# Patient Record
Sex: Female | Born: 1975 | Race: White | Hispanic: No | Marital: Married | Smoking: Never smoker
Health system: Southern US, Community
[De-identification: ages and names within clinical notes are randomized; demographics above are authoritative.]

## PROBLEM LIST (undated history)

## (undated) DIAGNOSIS — J45909 Unspecified asthma, uncomplicated: Secondary | ICD-10-CM

## (undated) DIAGNOSIS — G43909 Migraine, unspecified, not intractable, without status migrainosus: Secondary | ICD-10-CM

## (undated) DIAGNOSIS — T7840XA Allergy, unspecified, initial encounter: Secondary | ICD-10-CM

## (undated) DIAGNOSIS — M5030 Other cervical disc degeneration, unspecified cervical region: Secondary | ICD-10-CM

## (undated) DIAGNOSIS — M51369 Other intervertebral disc degeneration, lumbar region without mention of lumbar back pain or lower extremity pain: Secondary | ICD-10-CM

## (undated) DIAGNOSIS — M5136 Other intervertebral disc degeneration, lumbar region: Secondary | ICD-10-CM

## (undated) HISTORY — PX: TONSILLECTOMY: SUR1361

## (undated) HISTORY — DX: Allergy, unspecified, initial encounter: T78.40XA

## (undated) HISTORY — PX: OTHER SURGICAL HISTORY: SHX169

## (undated) HISTORY — PX: CARPAL TUNNEL RELEASE: SHX101

---

## 2005-04-24 ENCOUNTER — Emergency Department: Payer: Self-pay | Admitting: Emergency Medicine

## 2005-05-07 ENCOUNTER — Ambulatory Visit: Payer: Self-pay | Admitting: Otolaryngology

## 2006-08-06 ENCOUNTER — Emergency Department: Payer: Self-pay | Admitting: Emergency Medicine

## 2006-11-20 ENCOUNTER — Emergency Department: Payer: Self-pay

## 2007-06-22 ENCOUNTER — Emergency Department: Payer: Self-pay | Admitting: Emergency Medicine

## 2007-07-19 ENCOUNTER — Emergency Department: Payer: Self-pay | Admitting: Emergency Medicine

## 2008-07-27 ENCOUNTER — Emergency Department: Payer: Self-pay | Admitting: Emergency Medicine

## 2008-08-17 ENCOUNTER — Ambulatory Visit: Payer: Self-pay | Admitting: Gastroenterology

## 2009-02-08 ENCOUNTER — Emergency Department: Payer: Self-pay | Admitting: Internal Medicine

## 2009-02-18 ENCOUNTER — Emergency Department: Payer: Self-pay | Admitting: Emergency Medicine

## 2009-02-21 ENCOUNTER — Ambulatory Visit: Payer: Self-pay | Admitting: Internal Medicine

## 2009-04-04 ENCOUNTER — Ambulatory Visit: Payer: Self-pay | Admitting: Podiatry

## 2009-04-18 ENCOUNTER — Ambulatory Visit: Payer: Self-pay | Admitting: Podiatry

## 2009-12-22 ENCOUNTER — Emergency Department: Payer: Self-pay | Admitting: Emergency Medicine

## 2010-07-29 ENCOUNTER — Emergency Department: Payer: Self-pay | Admitting: Emergency Medicine

## 2010-09-06 ENCOUNTER — Emergency Department: Payer: Self-pay | Admitting: Emergency Medicine

## 2010-09-21 ENCOUNTER — Emergency Department: Payer: Self-pay | Admitting: *Deleted

## 2010-10-03 ENCOUNTER — Ambulatory Visit: Payer: Self-pay | Admitting: Physician Assistant

## 2010-11-26 ENCOUNTER — Ambulatory Visit: Payer: Self-pay | Admitting: Unknown Physician Specialty

## 2010-12-19 ENCOUNTER — Ambulatory Visit: Payer: Self-pay | Admitting: Unknown Physician Specialty

## 2011-01-22 IMAGING — CR RIGHT ANKLE - COMPLETE 3+ VIEW
1 series · 5 of 5 positions shown · non-contrast
Comparison: none

REASON FOR EXAM: pain 2nd to blunt truama
COMMENTS:   May transport without cardiac monitor

PROCEDURE:     DXR - DXR ANKLE RIGHT COMPLETE  - February 08, 2009 [DATE]
RESULT:     No fracture, dislocation or other acute bony abnormality is
identified. The ankle mortise is well-maintained.

[Series 1: view not recorded · 0.17mm/px · 5 of 5 slices shown]
[im 1/5]
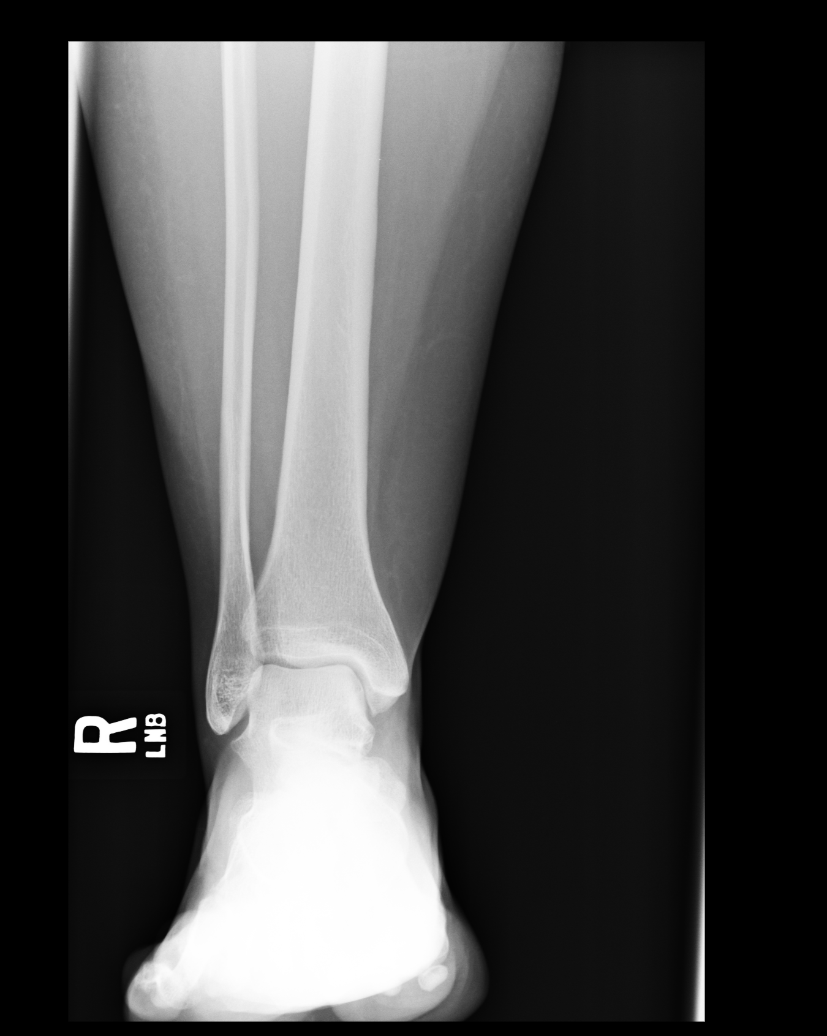
[im 2/5]
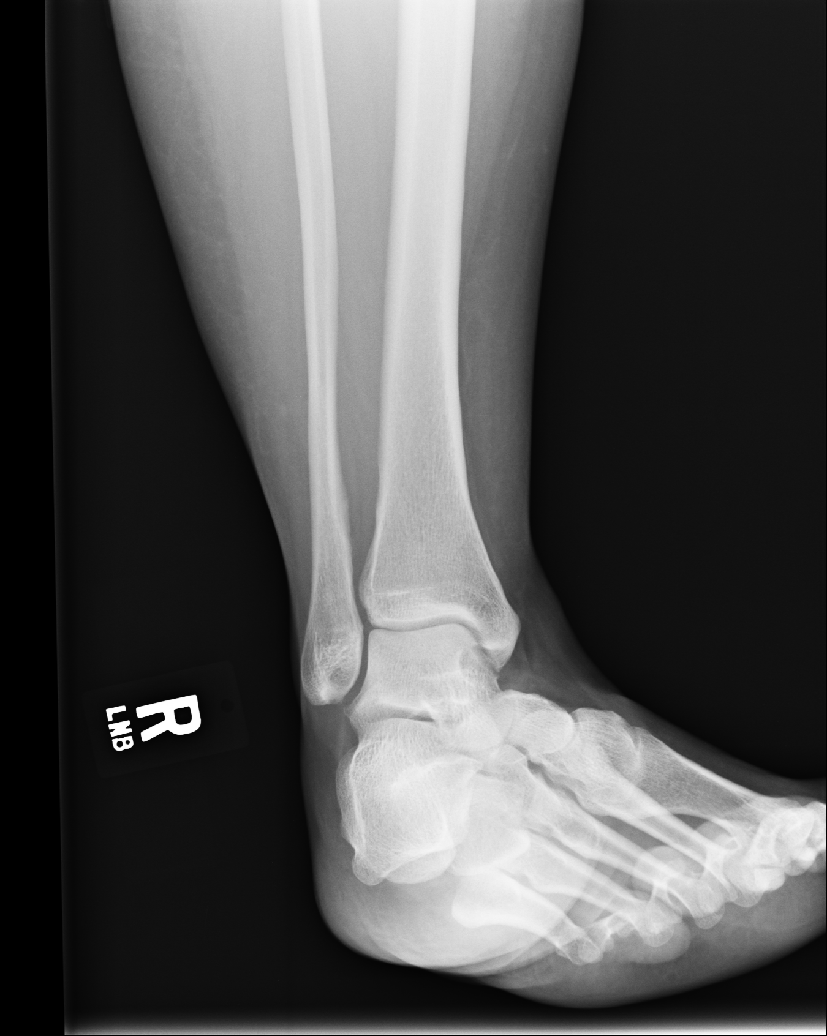
[im 3/5]
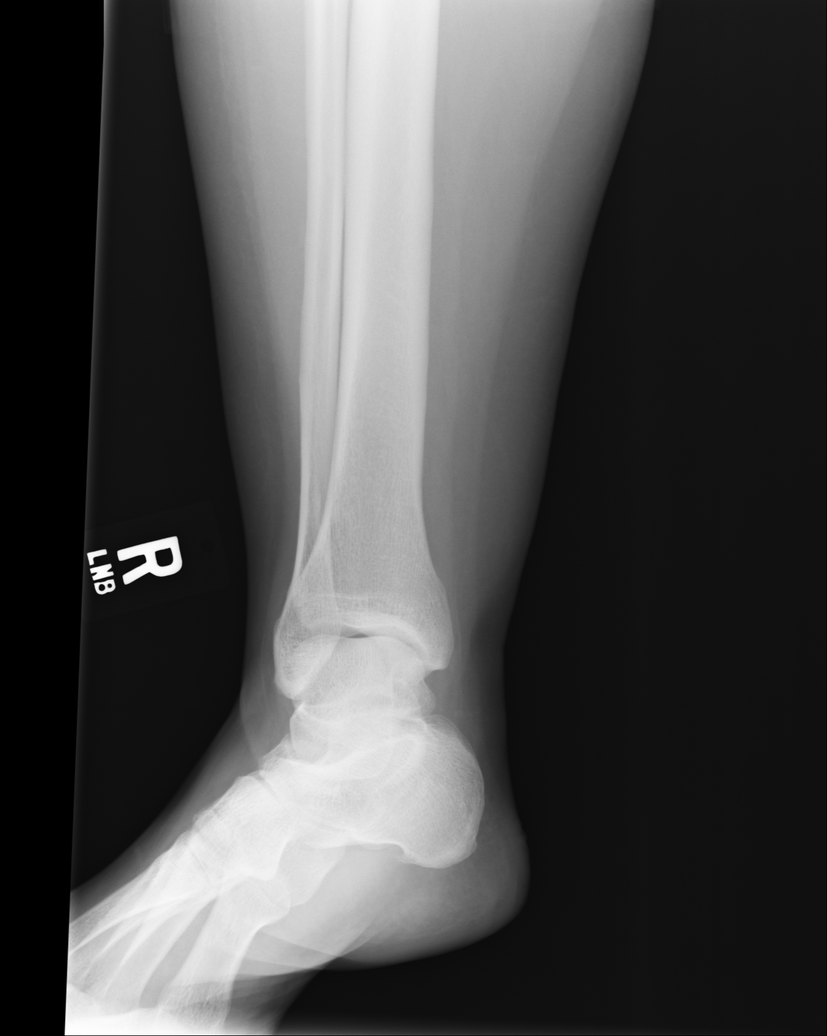
[im 4/5]
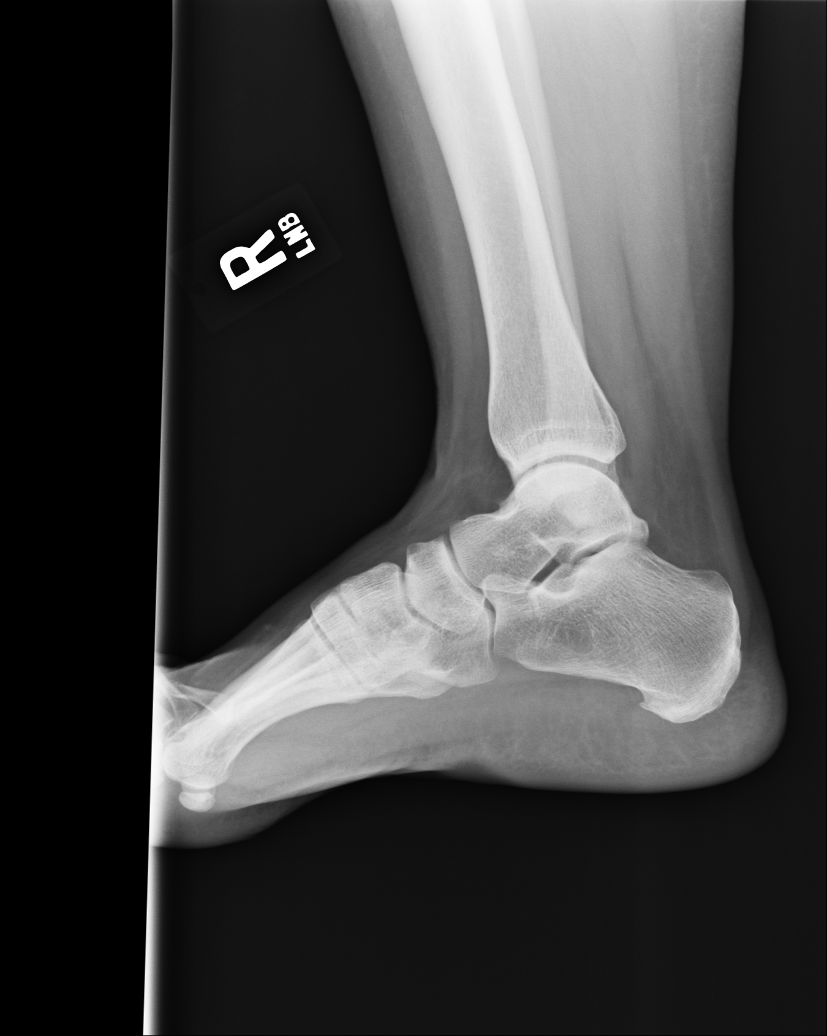
[im 5/5]
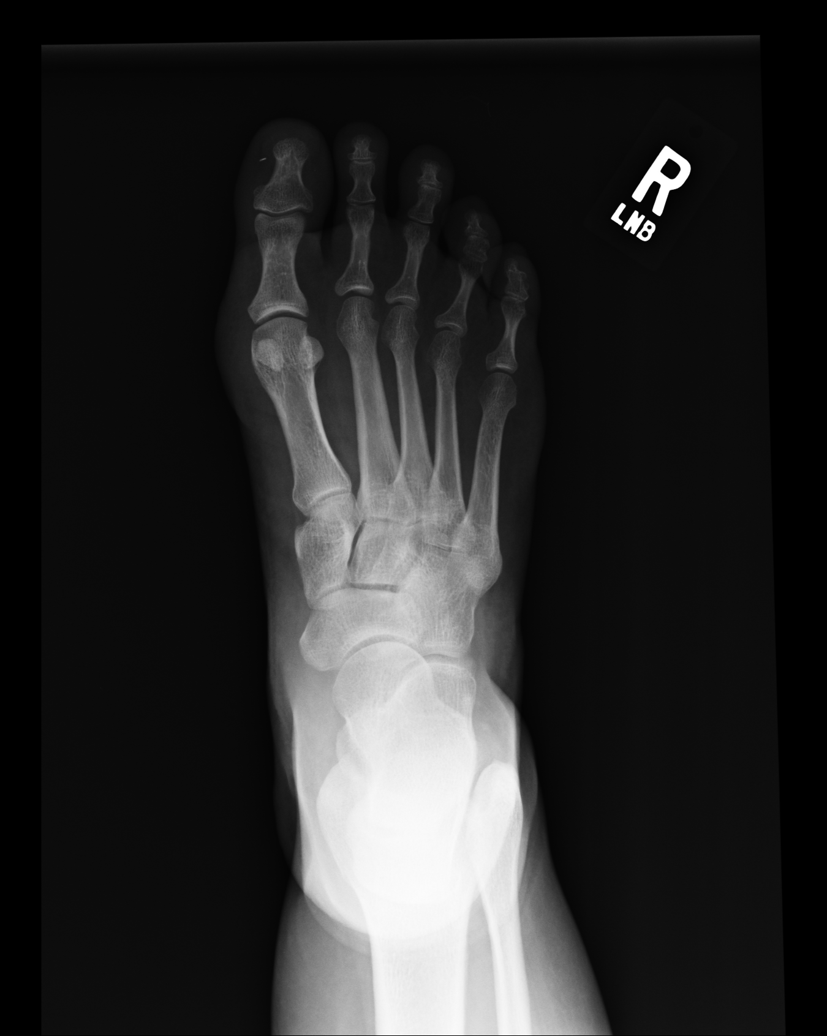

[5 of 5 positions shown; findings below may reference images not displayed]

IMPRESSION: 1.     No acute changes are identified.

## 2011-09-30 ENCOUNTER — Emergency Department: Payer: Self-pay | Admitting: Emergency Medicine

## 2012-03-26 ENCOUNTER — Emergency Department: Payer: Self-pay | Admitting: Emergency Medicine

## 2012-07-27 ENCOUNTER — Emergency Department: Payer: Self-pay | Admitting: Emergency Medicine

## 2012-11-16 ENCOUNTER — Emergency Department: Payer: Self-pay | Admitting: Emergency Medicine

## 2012-12-09 ENCOUNTER — Emergency Department: Payer: Self-pay | Admitting: Internal Medicine

## 2012-12-25 ENCOUNTER — Emergency Department: Payer: Self-pay | Admitting: Emergency Medicine

## 2012-12-25 LAB — COMPREHENSIVE METABOLIC PANEL
Chloride: 103 mmol/L (ref 98–107)
Co2: 26 mmol/L (ref 21–32)
Creatinine: 0.53 mg/dL — ABNORMAL LOW (ref 0.60–1.30)
EGFR (African American): >60
EGFR (Non-African Amer.): >60
Potassium: 3.9 mmol/L (ref 3.5–5.1)
SGOT(AST): 41 U/L — ABNORMAL HIGH (ref 15–37)
SGPT (ALT): 57 U/L (ref 12–78)
Sodium: 135 mmol/L — ABNORMAL LOW (ref 136–145)
Total Protein: 7.4 g/dL (ref 6.4–8.2)

## 2012-12-25 LAB — LIPASE, BLOOD: Lipase: 125 U/L (ref 73–393)

## 2012-12-25 LAB — URINALYSIS, COMPLETE
Bilirubin,UR: NEGATIVE
Glucose,UR: NEGATIVE mg/dL (ref 0–75)
Leukocyte Esterase: NEGATIVE
Ph: 6 (ref 4.5–8.0)
Protein: NEGATIVE
RBC,UR: 3 /HPF (ref 0–5)
Squamous Epithelial: 16
WBC UR: 2 /HPF (ref 0–5)

## 2012-12-25 LAB — CBC
HCT: 36.6 % (ref 35.0–47.0)
MCHC: 33.8 g/dL (ref 32.0–36.0)
RBC: 4.37 10*6/uL (ref 3.80–5.20)

## 2012-12-25 LAB — PREGNANCY, URINE: Pregnancy Test, Urine: NEGATIVE m[IU]/mL

## 2013-05-26 ENCOUNTER — Emergency Department: Payer: Self-pay | Admitting: Emergency Medicine

## 2013-05-26 LAB — COMPREHENSIVE METABOLIC PANEL
ALBUMIN: 3.9 g/dL (ref 3.4–5.0)
ANION GAP: 3 — AB (ref 7–16)
Alkaline Phosphatase: 108 U/L
BUN: 12 mg/dL (ref 7–18)
Bilirubin,Total: 0.5 mg/dL (ref 0.2–1.0)
CALCIUM: 8.7 mg/dL (ref 8.5–10.1)
CHLORIDE: 105 mmol/L (ref 98–107)
CREATININE: 0.69 mg/dL (ref 0.60–1.30)
Co2: 28 mmol/L (ref 21–32)
EGFR (African American): >60
Glucose: 90 mg/dL (ref 65–99)
OSMOLALITY: 271 (ref 275–301)
Potassium: 3.6 mmol/L (ref 3.5–5.1)
SGOT(AST): 28 U/L (ref 15–37)
SGPT (ALT): 44 U/L (ref 12–78)
Sodium: 136 mmol/L (ref 136–145)
Total Protein: 7.8 g/dL (ref 6.4–8.2)

## 2013-05-26 LAB — URINALYSIS, COMPLETE
BACTERIA: NONE SEEN
Bilirubin,UR: NEGATIVE
Blood: NEGATIVE
Glucose,UR: NEGATIVE mg/dL (ref 0–75)
KETONE: NEGATIVE
Leukocyte Esterase: NEGATIVE
Nitrite: NEGATIVE
Ph: 6 (ref 4.5–8.0)
Protein: NEGATIVE
RBC,UR: <1 /HPF (ref 0–5)
SPECIFIC GRAVITY: 1.02 (ref 1.003–1.030)
Squamous Epithelial: 4
WBC UR: 1 /HPF (ref 0–5)

## 2013-05-26 LAB — CBC
HCT: 37.9 % (ref 35.0–47.0)
HGB: 12.6 g/dL (ref 12.0–16.0)
MCH: 28 pg (ref 26.0–34.0)
MCHC: 33.2 g/dL (ref 32.0–36.0)
MCV: 84 fL (ref 80–100)
Platelet: 241 10*3/uL (ref 150–440)
RBC: 4.49 10*6/uL (ref 3.80–5.20)
RDW: 13.3 % (ref 11.5–14.5)
WBC: 8.7 10*3/uL (ref 3.6–11.0)

## 2013-05-26 LAB — CK TOTAL AND CKMB (NOT AT ARMC)
CK, Total: 37 U/L
CK-MB: 0.5 ng/mL (ref 0.5–3.6)

## 2013-05-26 LAB — TROPONIN I: Troponin-I: <0.02 ng/mL

## 2013-05-27 LAB — D-DIMER(ARMC): D-Dimer: 418 ng/ml

## 2013-05-27 LAB — TROPONIN I

## 2013-07-26 ENCOUNTER — Emergency Department: Payer: Self-pay | Admitting: Emergency Medicine

## 2013-07-27 LAB — CBC WITH DIFFERENTIAL/PLATELET
Basophil #: 0 10*3/uL (ref 0.0–0.1)
Basophil %: 0.5 %
EOS PCT: 2.1 %
Eosinophil #: 0.2 10*3/uL (ref 0.0–0.7)
HCT: 37 % (ref 35.0–47.0)
HGB: 12.3 g/dL (ref 12.0–16.0)
Lymphocyte #: 1.5 10*3/uL (ref 1.0–3.6)
Lymphocyte %: 17.3 %
MCH: 28.1 pg (ref 26.0–34.0)
MCHC: 33.2 g/dL (ref 32.0–36.0)
MCV: 85 fL (ref 80–100)
Monocyte #: 0.8 x10 3/mm (ref 0.2–0.9)
Monocyte %: 8.9 %
NEUTROS ABS: 6.1 10*3/uL (ref 1.4–6.5)
NEUTROS PCT: 71.2 %
Platelet: 223 10*3/uL (ref 150–440)
RBC: 4.37 10*6/uL (ref 3.80–5.20)
RDW: 13.2 % (ref 11.5–14.5)
WBC: 8.6 10*3/uL (ref 3.6–11.0)

## 2013-07-27 LAB — BASIC METABOLIC PANEL
ANION GAP: 6 — AB (ref 7–16)
BUN: 5 mg/dL — AB (ref 7–18)
CREATININE: 0.66 mg/dL (ref 0.60–1.30)
Calcium, Total: 8.8 mg/dL (ref 8.5–10.1)
Chloride: 107 mmol/L (ref 98–107)
Co2: 25 mmol/L (ref 21–32)
EGFR (Non-African Amer.): >60
GLUCOSE: 121 mg/dL — AB (ref 65–99)
Osmolality: 274 (ref 275–301)
Potassium: 3.6 mmol/L (ref 3.5–5.1)
SODIUM: 138 mmol/L (ref 136–145)

## 2013-09-13 DIAGNOSIS — J45909 Unspecified asthma, uncomplicated: Secondary | ICD-10-CM | POA: Insufficient documentation

## 2014-01-17 DIAGNOSIS — J0100 Acute maxillary sinusitis, unspecified: Secondary | ICD-10-CM

## 2014-01-17 HISTORY — DX: Acute maxillary sinusitis, unspecified: J01.00

## 2014-02-10 DIAGNOSIS — B029 Zoster without complications: Secondary | ICD-10-CM

## 2014-02-10 HISTORY — DX: Zoster without complications: B02.9

## 2014-03-13 DIAGNOSIS — E669 Obesity, unspecified: Secondary | ICD-10-CM | POA: Insufficient documentation

## 2014-03-13 HISTORY — DX: Obesity, unspecified: E66.9

## 2014-03-17 DIAGNOSIS — M51369 Other intervertebral disc degeneration, lumbar region without mention of lumbar back pain or lower extremity pain: Secondary | ICD-10-CM | POA: Insufficient documentation

## 2014-06-21 DIAGNOSIS — N920 Excessive and frequent menstruation with regular cycle: Secondary | ICD-10-CM

## 2014-06-21 DIAGNOSIS — R5383 Other fatigue: Secondary | ICD-10-CM | POA: Insufficient documentation

## 2014-06-21 HISTORY — DX: Excessive and frequent menstruation with regular cycle: N92.0

## 2015-01-02 ENCOUNTER — Other Ambulatory Visit: Payer: Self-pay | Admitting: Internal Medicine

## 2015-01-02 ENCOUNTER — Ambulatory Visit
Admission: RE | Admit: 2015-01-02 | Discharge: 2015-01-02 | Disposition: A | Discharge status: Home / Self Care | Payer: Disability Insurance | Source: Ambulatory Visit | Attending: Internal Medicine | Admitting: Internal Medicine

## 2015-01-02 DIAGNOSIS — M549 Dorsalgia, unspecified: Secondary | ICD-10-CM

## 2015-01-02 DIAGNOSIS — M5136 Other intervertebral disc degeneration, lumbar region: Secondary | ICD-10-CM | POA: Insufficient documentation

## 2015-01-02 DIAGNOSIS — R200 Anesthesia of skin: Secondary | ICD-10-CM

## 2015-01-02 DIAGNOSIS — R208 Other disturbances of skin sensation: Secondary | ICD-10-CM | POA: Insufficient documentation

## 2015-04-06 DIAGNOSIS — M79673 Pain in unspecified foot: Secondary | ICD-10-CM | POA: Insufficient documentation

## 2015-04-06 DIAGNOSIS — M542 Cervicalgia: Secondary | ICD-10-CM

## 2015-04-06 DIAGNOSIS — M79643 Pain in unspecified hand: Secondary | ICD-10-CM

## 2015-04-06 HISTORY — DX: Pain in unspecified hand: M79.643

## 2015-04-06 HISTORY — DX: Cervicalgia: M54.2

## 2015-04-06 HISTORY — DX: Pain in unspecified foot: M79.673

## 2016-08-07 DIAGNOSIS — N926 Irregular menstruation, unspecified: Secondary | ICD-10-CM

## 2016-08-07 HISTORY — DX: Irregular menstruation, unspecified: N92.6

## 2016-08-15 DIAGNOSIS — Z319 Encounter for procreative management, unspecified: Secondary | ICD-10-CM

## 2016-08-15 HISTORY — DX: Encounter for procreative management, unspecified: Z31.9

## 2016-12-23 DIAGNOSIS — R002 Palpitations: Secondary | ICD-10-CM

## 2016-12-23 HISTORY — DX: Palpitations: R00.2

## 2017-03-09 ENCOUNTER — Emergency Department: Payer: Managed Care, Other (non HMO)

## 2017-03-09 ENCOUNTER — Other Ambulatory Visit: Payer: Self-pay

## 2017-03-09 ENCOUNTER — Encounter: Payer: Self-pay | Admitting: Emergency Medicine

## 2017-03-09 ENCOUNTER — Emergency Department
Admission: EM | Admit: 2017-03-09 | Discharge: 2017-03-09 | Disposition: A | Discharge status: Home / Self Care | Payer: Managed Care, Other (non HMO) | Attending: Student in an Organized Health Care Education/Training Program | Admitting: Student in an Organized Health Care Education/Training Program

## 2017-03-09 DIAGNOSIS — J45909 Unspecified asthma, uncomplicated: Secondary | ICD-10-CM | POA: Insufficient documentation

## 2017-03-09 DIAGNOSIS — R1031 Right lower quadrant pain: Secondary | ICD-10-CM | POA: Diagnosis not present

## 2017-03-09 DIAGNOSIS — K5900 Constipation, unspecified: Secondary | ICD-10-CM | POA: Diagnosis not present

## 2017-03-09 DIAGNOSIS — R109 Unspecified abdominal pain: Secondary | ICD-10-CM

## 2017-03-09 HISTORY — DX: Unspecified asthma, uncomplicated: J45.909

## 2017-03-09 LAB — CBC WITH DIFFERENTIAL/PLATELET
Basophils Absolute: 0 10*3/uL (ref 0–0.1)
Basophils Relative: 1 %
Eosinophils Absolute: 0.4 10*3/uL (ref 0–0.7)
Eosinophils Relative: 5 %
HEMATOCRIT: 37.7 % (ref 35.0–47.0)
HEMOGLOBIN: 12.5 g/dL (ref 12.0–16.0)
LYMPHS ABS: 1.5 10*3/uL (ref 1.0–3.6)
Lymphocytes Relative: 19 %
MCH: 27.6 pg (ref 26.0–34.0)
MCHC: 33.1 g/dL (ref 32.0–36.0)
MCV: 83.2 fL (ref 80.0–100.0)
MONOS PCT: 9 %
Monocytes Absolute: 0.7 10*3/uL (ref 0.2–0.9)
NEUTROS ABS: 5 10*3/uL (ref 1.4–6.5)
NEUTROS PCT: 66 %
Platelets: 266 10*3/uL (ref 150–440)
RBC: 4.53 MIL/uL (ref 3.80–5.20)
RDW: 13.3 % (ref 11.5–14.5)
WBC: 7.5 10*3/uL (ref 3.6–11.0)

## 2017-03-09 LAB — COMPREHENSIVE METABOLIC PANEL
ALBUMIN: 4.1 g/dL (ref 3.5–5.0)
ALT: 28 U/L (ref 14–54)
ANION GAP: 8 (ref 5–15)
AST: 26 U/L (ref 15–41)
Alkaline Phosphatase: 91 U/L (ref 38–126)
BUN: 11 mg/dL (ref 6–20)
CHLORIDE: 105 mmol/L (ref 101–111)
CO2: 23 mmol/L (ref 22–32)
CREATININE: 0.56 mg/dL (ref 0.44–1.00)
Calcium: 8.8 mg/dL — ABNORMAL LOW (ref 8.9–10.3)
GFR calc non Af Amer: >60 mL/min (ref >60)
Glucose, Bld: 107 mg/dL — ABNORMAL HIGH (ref 65–99)
Potassium: 4.6 mmol/L (ref 3.5–5.1)
SODIUM: 136 mmol/L (ref 135–145)
Total Bilirubin: 0.8 mg/dL (ref 0.3–1.2)
Total Protein: 7.5 g/dL (ref 6.5–8.1)

## 2017-03-09 LAB — URINALYSIS, COMPLETE (UACMP) WITH MICROSCOPIC
BACTERIA UA: NONE SEEN
BILIRUBIN URINE: NEGATIVE
Glucose, UA: NEGATIVE mg/dL
Hgb urine dipstick: NEGATIVE
KETONES UR: NEGATIVE mg/dL
LEUKOCYTES UA: NEGATIVE
Nitrite: NEGATIVE
PROTEIN: NEGATIVE mg/dL
SPECIFIC GRAVITY, URINE: 1.024 (ref 1.005–1.030)
pH: 5 (ref 5.0–8.0)

## 2017-03-09 LAB — POCT PREGNANCY, URINE: PREG TEST UR: NEGATIVE

## 2017-03-09 MED ORDER — POLYETHYLENE GLYCOL 3350 17 G PO PACK: 17.0000 g | PACK | Freq: Every day | ORAL | 0 refills | Status: DC

## 2017-03-09 MED ORDER — LIDOCAINE 5 % EX PTCH
1.0000 | MEDICATED_PATCH | CUTANEOUS | Status: DC
  Administered 2017-03-09: 1 via TRANSDERMAL
  Filled 2017-03-09: qty 1

## 2017-03-09 MED ORDER — NAPROXEN 500 MG PO TABS: 500.0000 mg | ORAL_TABLET | Freq: Two times a day (BID) | ORAL | 0 refills | Status: AC

## 2017-03-09 MED ORDER — HYDROCODONE-ACETAMINOPHEN 5-325 MG PO TABS
1.0000 | ORAL_TABLET | Freq: Once | ORAL | Status: AC
  Administered 2017-03-09: 1 via ORAL
  Filled 2017-03-09: qty 1

## 2017-03-09 MED ORDER — CYCLOBENZAPRINE HCL 10 MG PO TABS: 10.0000 mg | ORAL_TABLET | Freq: Three times a day (TID) | ORAL | 0 refills | Status: DC | PRN

## 2017-03-09 NOTE — ED Notes (Signed)
Patient denies pain and is resting comfortably.   STates pain worsens with movement.

## 2017-03-09 NOTE — ED Triage Notes (Signed)
Pt c/o right sided flank pain that is pressure, and stabbing in nature that comes in spasms, that has been going on for over a month. Pt reports no difficulty or pain during urination. Pt states having S1, L4, L5 deteriorating disc disease. Pt is ambulatory to triage, and able to speak in complete sentences and answers all questions appropriately.

## 2017-03-09 NOTE — ED Notes (Signed)
AAOx3.  Skin warm and dry.  NAD 

## 2017-03-09 NOTE — ED Provider Notes (Signed)
[facility] Emergency Department Provider Note    First MD Initiated Contact with Patient 03/09/17 [phone]     (approximate)  I have reviewed the triage vital signs and the nursing notes.   HISTORY  Chief Complaint Flank Pain    HPI Tammy Hansen is a 41 y.o. female history of asthma presents with 1 week of worsening stabbing mild to moderate right flank and back pain.  States that it is intermittent in nature.  States that it became severe 1 week ago but states that she has been noticing some discomfort for over 1 month.  Denies any fevers.  No dysuria, hematuria.  No diarrhea.  No constipation.  No vaginal discharge.  States she does have a history of ovarian cyst but this feels very different.  Past Medical History:  Diagnosis Date  . Asthma    Family History  Problem Relation Age of Onset  . Hyperlipidemia Mother   . Diabetes Mother   . Heart failure Mother    Past Surgical History:  Procedure Laterality Date  . CESAREAN SECTION    . Tendons replaced    . TONSILLECTOMY     There are no active problems to display for this patient.     Prior to Admission medications   Medication Sig Start Date End Date Taking? Authorizing Provider  cyclobenzaprine (FLEXERIL) 10 MG tablet Take 1 tablet (10 mg total) by mouth 3 (three) times daily as needed for muscle spasms. 03/09/17   Willy Eddy, MD  naproxen (NAPROSYN) 500 MG tablet Take 1 tablet (500 mg total) by mouth 2 (two) times daily with a meal. 03/09/17 03/09/18  Willy Eddy, MD  polyethylene glycol (MIRALAX / Ethelene Hal) packet Take 17 g by mouth daily. Mix one tablespoon with 8oz of your favorite juice or water every day until you are having soft formed stools. Then start taking once daily if you didn't have a stool the day before. 03/09/17   Willy Eddy, MD    Allergies Patient has no known allergies.    Social History Social History   Tobacco Use  . Smoking status: Never  Smoker  . Smokeless tobacco: Never Used  Substance Use Topics  . Alcohol use: Yes    Alcohol/week: 0.6 oz    Types: 1 Standard drinks or equivalent per week  . Drug use: No    Review of Systems Patient denies headaches, rhinorrhea, blurry vision, numbness, shortness of breath, chest pain, edema, cough, abdominal pain, nausea, vomiting, diarrhea, dysuria, fevers, rashes or hallucinations unless otherwise stated above in HPI. ____________________________________________   PHYSICAL EXAM:  VITAL SIGNS: Vitals:   03/09/17 0502  BP: (!) 140/95  Pulse: 78  Resp: 15  Temp: 97.8 F (36.6 C)  SpO2: 98%    Constitutional: Alert and oriented. Well appearing and in no acute distress. Eyes: Conjunctivae are normal.  Head: Atraumatic. Nose: No congestion/rhinnorhea. Mouth/Throat: Mucous membranes are moist.   Neck: No stridor. Painless ROM.  Cardiovascular: Normal rate, regular rhythm. Grossly normal heart sounds.  Good peripheral circulation. Respiratory: Normal respiratory effort.  No retractions. Lungs CTAB. Gastrointestinal: Soft and nontender. No distention. No abdominal bruits. + R CVA tenderness. Genitourinary:  Musculoskeletal: No lower extremity tenderness nor edema.  No joint effusions. Neurologic:  Normal speech and language. No gross focal neurologic deficits are appreciated. No facial droop Skin:  Skin is warm, dry and intact. No rash noted. Psychiatric: Mood and affect are normal. Speech and behavior are normal.  ____________________________________________  LABS (all labs ordered are listed, but only abnormal results are displayed)  Results for orders placed or performed during the hospital encounter of 03/09/17 (from the past 24 hour(s))  Urinalysis, Complete w Microscopic     Status: Abnormal   Collection Time: 03/09/17  5:12 AM  Result Value Ref Range   Color, Urine YELLOW (A) YELLOW   APPearance CLEAR (A) CLEAR   Specific Gravity, Urine 1.024 1.005 - 1.030    pH 5.0 5.0 - 8.0   Glucose, UA NEGATIVE NEGATIVE mg/dL   Hgb urine dipstick NEGATIVE NEGATIVE   Bilirubin Urine NEGATIVE NEGATIVE   Ketones, ur NEGATIVE NEGATIVE mg/dL   Protein, ur NEGATIVE NEGATIVE mg/dL   Nitrite NEGATIVE NEGATIVE   Leukocytes, UA NEGATIVE NEGATIVE   RBC / HPF 0-5 0 - 5 RBC/hpf   WBC, UA 0-5 0 - 5 WBC/hpf   Bacteria, UA NONE SEEN NONE SEEN   Squamous Epithelial / LPF 0-5 (A) NONE SEEN   Mucus PRESENT   Pregnancy, urine POC     Status: None   Collection Time: 03/09/17  5:20 AM  Result Value Ref Range   Preg Test, Ur NEGATIVE NEGATIVE  Comprehensive metabolic panel     Status: Abnormal   Collection Time: 03/09/17  8:05 AM  Result Value Ref Range   Sodium 136 135 - 145 mmol/L   Potassium 4.6 3.5 - 5.1 mmol/L   Chloride 105 101 - 111 mmol/L   CO2 23 22 - 32 mmol/L   Glucose, Bld 107 (H) 65 - 99 mg/dL   BUN 11 6 - 20 mg/dL   Creatinine, Ser 4.090.56 0.44 - 1.00 mg/dL   Calcium 8.8 (L) 8.9 - 10.3 mg/dL   Total Protein 7.5 6.5 - 8.1 g/dL   Albumin 4.1 3.5 - 5.0 g/dL   AST 26 15 - 41 U/L   ALT 28 14 - 54 U/L   Alkaline Phosphatase 91 38 - 126 U/L   Total Bilirubin 0.8 0.3 - 1.2 mg/dL   GFR calc non Af Amer >60 >60 mL/min   GFR calc Af Amer >60 >60 mL/min   Anion gap 8 5 - 15   ____________________________________________ ____________________________________________  RADIOLOGY  I personally reviewed all radiographic images ordered to evaluate for the above acute complaints and reviewed radiology reports and findings.  These findings were personally discussed with the patient.  Please see medical record for radiology report.  ____________________________________________   PROCEDURES  Procedure(s) performed:  Procedures    Critical Care performed: no ____________________________________________   INITIAL IMPRESSION / ASSESSMENT AND PLAN / ED COURSE  Pertinent labs & imaging results that were available during my care of the patient were reviewed by me  and considered in my medical decision making (see chart for details).  DDX: stone, pyelo, colitis, shingles, msk strain  Tammy Hansen is a 41 y.o. who presents to the ED with generative disc disease presenting with right flank pain.  Urinalysis without any evidence of hematuria.  Patient is not pregnant.  CT imaging ordered due to acuity of pain to evaluate for the above differential shows no evidence of stone.  There is no evidence of colitis but there is significant constipation.  Her abdominal exam is soft and benign.  Not clinically consistent with appendicitis.  May have sub-millimeter stone causing some irritation but given her history of degenerative disc disease muscular skeletal pain is most likely.  No focal neuro deficit to suggest cord impingement or radiculopathy.  Patient will be given  oral and topical pain management.  She has no respiratory distress.  At this point I do believe patient is stable and appropriate for follow-up with PCP.      ____________________________________________   FINAL CLINICAL IMPRESSION(S) / ED DIAGNOSES  Final diagnoses:  Right flank pain  Constipation, unspecified constipation type      NEW MEDICATIONS STARTED DURING THIS VISIT:  This SmartLink is deprecated. Use AVSMEDLIST instead to display the medication list for a patient.   Note:  This document was prepared using Dragon voice recognition software and may include unintentional dictation errors.    Willy Eddy, MD 03/09/17 [id number]

## 2017-03-09 NOTE — Discharge Instructions (Signed)

## 2017-06-05 DIAGNOSIS — R6882 Decreased libido: Secondary | ICD-10-CM

## 2017-06-05 DIAGNOSIS — J3089 Other allergic rhinitis: Secondary | ICD-10-CM | POA: Insufficient documentation

## 2017-06-05 HISTORY — DX: Decreased libido: R68.82

## 2017-06-05 HISTORY — DX: Other allergic rhinitis: J30.89

## 2018-02-07 ENCOUNTER — Emergency Department: Payer: Managed Care, Other (non HMO)

## 2018-02-07 ENCOUNTER — Other Ambulatory Visit: Payer: Self-pay

## 2018-02-07 ENCOUNTER — Encounter: Payer: Self-pay | Admitting: Emergency Medicine

## 2018-02-07 ENCOUNTER — Emergency Department
Admission: EM | Admit: 2018-02-07 | Discharge: 2018-02-07 | Disposition: A | Discharge status: Home / Self Care | Payer: Managed Care, Other (non HMO) | Attending: Student in an Organized Health Care Education/Training Program | Admitting: Student in an Organized Health Care Education/Training Program

## 2018-02-07 DIAGNOSIS — N83202 Unspecified ovarian cyst, left side: Secondary | ICD-10-CM

## 2018-02-07 DIAGNOSIS — J40 Bronchitis, not specified as acute or chronic: Secondary | ICD-10-CM | POA: Diagnosis not present

## 2018-02-07 DIAGNOSIS — Z79899 Other long term (current) drug therapy: Secondary | ICD-10-CM | POA: Insufficient documentation

## 2018-02-07 DIAGNOSIS — R05 Cough: Secondary | ICD-10-CM | POA: Diagnosis present

## 2018-02-07 DIAGNOSIS — N83292 Other ovarian cyst, left side: Secondary | ICD-10-CM | POA: Insufficient documentation

## 2018-02-07 DIAGNOSIS — R1032 Left lower quadrant pain: Secondary | ICD-10-CM

## 2018-02-07 LAB — COMPREHENSIVE METABOLIC PANEL
ALBUMIN: 4 g/dL (ref 3.5–5.0)
ALT: 20 U/L (ref 0–44)
ANION GAP: 7 (ref 5–15)
AST: 18 U/L (ref 15–41)
Alkaline Phosphatase: 81 U/L (ref 38–126)
BUN: 8 mg/dL (ref 6–20)
CHLORIDE: 104 mmol/L (ref 98–111)
CO2: 26 mmol/L (ref 22–32)
Calcium: 8.7 mg/dL — ABNORMAL LOW (ref 8.9–10.3)
Creatinine, Ser: 0.48 mg/dL (ref 0.44–1.00)
GFR calc non Af Amer: >60 mL/min (ref >60)
GLUCOSE: 105 mg/dL — AB (ref 70–99)
POTASSIUM: 3.8 mmol/L (ref 3.5–5.1)
Sodium: 137 mmol/L (ref 135–145)
Total Bilirubin: 0.3 mg/dL (ref 0.3–1.2)
Total Protein: 7.1 g/dL (ref 6.5–8.1)

## 2018-02-07 LAB — URINALYSIS, COMPLETE (UACMP) WITH MICROSCOPIC
Bilirubin Urine: NEGATIVE
GLUCOSE, UA: NEGATIVE mg/dL
Hgb urine dipstick: NEGATIVE
Ketones, ur: NEGATIVE mg/dL
LEUKOCYTES UA: NEGATIVE
NITRITE: NEGATIVE
PH: 7 (ref 5.0–8.0)
PROTEIN: NEGATIVE mg/dL
Specific Gravity, Urine: 1.006 (ref 1.005–1.030)

## 2018-02-07 LAB — POCT PREGNANCY, URINE: Preg Test, Ur: NEGATIVE

## 2018-02-07 LAB — CBC
HCT: 40 % (ref 36.0–46.0)
HEMOGLOBIN: 12.8 g/dL (ref 12.0–15.0)
MCH: 27.2 pg (ref 26.0–34.0)
MCHC: 32 g/dL (ref 30.0–36.0)
MCV: 85.1 fL (ref 80.0–100.0)
NRBC: 0 % (ref 0.0–0.2)
Platelets: 265 10*3/uL (ref 150–400)
RBC: 4.7 MIL/uL (ref 3.87–5.11)
RDW: 12.6 % (ref 11.5–15.5)
WBC: 7.3 10*3/uL (ref 4.0–10.5)

## 2018-02-07 LAB — LIPASE, BLOOD: Lipase: 33 U/L (ref 11–51)

## 2018-02-07 MED ORDER — PREDNISONE 10 MG PO TABS: ORAL_TABLET | ORAL | 0 refills | Status: DC

## 2018-02-07 MED ORDER — AZITHROMYCIN 250 MG PO TABS: ORAL_TABLET | ORAL | 0 refills | Status: DC

## 2018-02-07 MED ORDER — IBUPROFEN 600 MG PO TABS: 600.0000 mg | ORAL_TABLET | Freq: Four times a day (QID) | ORAL | 0 refills | Status: DC | PRN

## 2018-02-07 NOTE — ED Triage Notes (Signed)
PT reports productive cough for the last month. Pt denies relief with OTC medication regimen. Pt states she has coughed so much she feels like she "pulled something."

## 2018-02-07 NOTE — ED Notes (Signed)
Urine preg negative

## 2018-02-07 NOTE — ED Provider Notes (Signed)
[facility] Emergency Department Provider Note  ____________________________________________  Time seen: Approximately 5:13 PM  I have reviewed the triage vital signs and the nursing notes.   HISTORY  Chief Complaint Cough    HPI Tammy Hansen is a 42 y.o. female that presents to emergency department for evaluation of nasal congestion and productive cough for 1 week and left lower quadrant pain for 2 weeks.  Patient states that she has had an ovarian cyst before and has had pain in this area.  She thought that it was initially so she did not come to the emergency department but pain has not resolved.  It is constant and sharp in character.  No concern for STD.  No fever, nausea, vomiting, dysuria, vaginal discharge.  Past Medical History:  Diagnosis Date  . Asthma     There are no active problems to display for this patient.   Past Surgical History:  Procedure Laterality Date  . CESAREAN SECTION    . Tendons replaced    . TONSILLECTOMY      Prior to Admission medications   Medication Sig Start Date End Date Taking? Authorizing Provider  azithromycin (ZITHROMAX Z-PAK) 250 MG tablet Take 2 tablets (500 mg) on  Day 1,  followed by 1 tablet (250 mg) once daily on Days 2 through 5. 02/07/18   Enid DerryWagner, Gurshaan Matsuoka, PA-C  cyclobenzaprine (FLEXERIL) 10 MG tablet Take 1 tablet (10 mg total) by mouth 3 (three) times daily as needed for muscle spasms. 03/09/17   Willy Eddyobinson, Patrick, MD  ibuprofen (ADVIL,MOTRIN) 600 MG tablet Take 1 tablet (600 mg total) by mouth every 6 (six) hours as needed. 02/07/18   Enid DerryWagner, Vicki Chaffin, PA-C  naproxen (NAPROSYN) 500 MG tablet Take 1 tablet (500 mg total) by mouth 2 (two) times daily with a meal. 03/09/17 03/09/18  Willy Eddyobinson, Patrick, MD  polyethylene glycol (MIRALAX / Ethelene HalGLYCOLAX) packet Take 17 g by mouth daily. Mix one tablespoon with 8oz of your favorite juice or water every day until you are having soft formed stools. Then start taking once  daily if you didn't have a stool the day before. 03/09/17   Willy Eddyobinson, Patrick, MD  predniSONE (DELTASONE) 10 MG tablet Take 6 tablets on day 1, take 5 tablets on day 2, take 4 tablets on day 3, take 3 tablets on day 4, take 2 tablets on day 5, take 1 tablet on day 6 02/07/18   Enid DerryWagner, Braniyah Besse, PA-C    Allergies Patient has no known allergies.  Family History  Problem Relation Age of Onset  . Hyperlipidemia Mother   . Diabetes Mother   . Heart failure Mother     Social History Social History   Tobacco Use  . Smoking status: Never Smoker  . Smokeless tobacco: Never Used  Substance Use Topics  . Alcohol use: Yes    Alcohol/week: 1.0 standard drinks    Types: 1 Standard drinks or equivalent per week  . Drug use: No     Review of Systems  Constitutional: No fever/chills Eyes: No visual changes. No discharge. ENT: Positive for congestion and rhinorrhea. Cardiovascular: No chest pain. Respiratory: Positive for cough. No SOB. Gastrointestinal: Positive for abdominal pain.  No nausea, no vomiting.   Musculoskeletal: Negative for musculoskeletal pain. Skin: Negative for rash, abrasions, lacerations, ecchymosis. Neurological: Negative for headaches.   ____________________________________________   PHYSICAL EXAM:  VITAL SIGNS: ED Triage Vitals  Enc Vitals Group     BP 02/07/18 1536 (!) 120/50     Pulse  Rate 02/07/18 1536 78     Resp 02/07/18 1536 18     Temp 02/07/18 1536 98.2 F (36.8 C)     Temp Source 02/07/18 1536 Oral     SpO2 02/07/18 1536 97 %     Weight 02/07/18 1542 236 lb 15.9 oz (107.5 kg)     Height 02/07/18 1542 5\' 3"  (1.6 m)     Head Circumference --      Peak Flow --      Pain Score 02/07/18 1542 7     Pain Loc --      Pain Edu? --      Excl. in GC? --      Constitutional: Alert and oriented. Well appearing and in no acute distress. Eyes: Conjunctivae are normal. PERRL. EOMI. No discharge. Head: Atraumatic. ENT: No frontal and maxillary sinus  tenderness.      Ears: Tympanic membranes pearly gray with good landmarks. No discharge.      Nose: Mild congestion/rhinnorhea.      Mouth/Throat: Mucous membranes are moist. Oropharynx non-erythematous. Tonsils not enlarged. No exudates. Uvula midline. Neck: No stridor.   Hematological/Lymphatic/Immunilogical: No cervical lymphadenopathy. Cardiovascular: Normal rate, regular rhythm.  Good peripheral circulation. Respiratory: Normal respiratory effort without tachypnea or retractions. Lungs CTAB. Good air entry to the bases with no decreased or absent breath sounds. Gastrointestinal: Bowel sounds 4 quadrants.  Left lower quadrant tenderness. No guarding or rigidity. No palpable masses. No distention. Musculoskeletal: Full range of motion to all extremities. No gross deformities appreciated. Walking around ED.  Neurologic:  Normal speech and language. No gross focal neurologic deficits are appreciated.  Skin:  Skin is warm, dry and intact. No rash noted. Psychiatric: Mood and affect are normal. Speech and behavior are normal. Patient exhibits appropriate insight and judgement.   ____________________________________________   LABS (all labs ordered are listed, but only abnormal results are displayed)  Labs Reviewed  COMPREHENSIVE METABOLIC PANEL - Abnormal; Notable for the following components:      Result Value   Glucose, Bld 105 (*)    Calcium 8.7 (*)    All other components within normal limits  URINALYSIS, COMPLETE (UACMP) WITH MICROSCOPIC - Abnormal; Notable for the following components:   Color, Urine STRAW (*)    APPearance CLEAR (*)    Bacteria, UA RARE (*)    All other components within normal limits  CBC  LIPASE, BLOOD  POC URINE PREG, ED  POCT PREGNANCY, URINE   ____________________________________________  EKG   ____________________________________________  RADIOLOGY Lexine Baton, personally viewed and evaluated these images (plain radiographs) as part of my  medical decision making, as well as reviewing the written report by the radiologist.  Dg Chest 2 View  Result Date: 02/07/2018 CLINICAL DATA:  Productive cough for 1 month. EXAM: CHEST - 2 VIEW COMPARISON:  PA and lateral chest 05/26/2013. FINDINGS: The lungs are clear. Heart size is normal. No pneumothorax or pleural fluid. No acute or focal bony abnormality. IMPRESSION: Negative chest. Electronically Signed   By: Drusilla Kanner M.D.   On: 02/07/2018 16:27   US Pelvis Transvanginal Non-ob (tv Only)  Result Date: 02/07/2018 CLINICAL DATA:  Left lower quadrant pain EXAM: TRANSABDOMINAL AND TRANSVAGINAL ULTRASOUND OF PELVIS TECHNIQUE: Both transabdominal and transvaginal ultrasound examinations of the pelvis were performed. Transabdominal technique was performed for global imaging of the pelvis including uterus, ovaries, adnexal regions, and pelvic cul-de-sac. It was necessary to proceed with endovaginal exam following the transabdominal exam to visualize the uterus endometrium  and ovaries. COMPARISON:  None FINDINGS: Uterus Measurements: 8.9 cm length by 4.7 cm height by 5.3 cm wide = volume: 115.6 mL. No fibroids or other mass visualized. Endometrium Thickness: 8.2 mm.  No focal abnormality visualized. Right ovary Measurements: 2.5 x 1.2 x 2.1 cm = volume: 3.4 mL. Normal appearance/no adnexal mass. Left ovary Measurements: 5 x 4.5 x 4.3 cm = volume: 52.2 mL. Complex cyst measuring 3.9 x 3.6 x 4.1 cm Other findings No abnormal free fluid. IMPRESSION: 1. 4.1 cm complex left ovarian cyst, possible hemorrhagic cyst. Suggest 6-12 week sonographic follow-up to ensure resolution. 2. Otherwise negative pelvic ultrasound Electronically Signed   By: Jasmine Pang M.D.   On: 02/07/2018 19:15   US Pelvis Complete  Result Date: 02/07/2018 CLINICAL DATA:  Left lower quadrant pain EXAM: TRANSABDOMINAL AND TRANSVAGINAL ULTRASOUND OF PELVIS TECHNIQUE: Both transabdominal and transvaginal ultrasound examinations of the  pelvis were performed. Transabdominal technique was performed for global imaging of the pelvis including uterus, ovaries, adnexal regions, and pelvic cul-de-sac. It was necessary to proceed with endovaginal exam following the transabdominal exam to visualize the uterus endometrium and ovaries. COMPARISON:  None FINDINGS: Uterus Measurements: 8.9 cm length by 4.7 cm height by 5.3 cm wide = volume: 115.6 mL. No fibroids or other mass visualized. Endometrium Thickness: 8.2 mm.  No focal abnormality visualized. Right ovary Measurements: 2.5 x 1.2 x 2.1 cm = volume: 3.4 mL. Normal appearance/no adnexal mass. Left ovary Measurements: 5 x 4.5 x 4.3 cm = volume: 52.2 mL. Complex cyst measuring 3.9 x 3.6 x 4.1 cm Other findings No abnormal free fluid. IMPRESSION: 1. 4.1 cm complex left ovarian cyst, possible hemorrhagic cyst. Suggest 6-12 week sonographic follow-up to ensure resolution. 2. Otherwise negative pelvic ultrasound Electronically Signed   By: Jasmine Pang M.D.   On: 02/07/2018 19:15    ____________________________________________    PROCEDURES  Procedure(s) performed:    Procedures    Medications - No data to display   ____________________________________________   INITIAL IMPRESSION / ASSESSMENT AND PLAN / ED COURSE  Pertinent labs & imaging results that were available during my care of the patient were reviewed by me and considered in my medical decision making (see chart for details).  Review of the [place] CSRS was performed in accordance of the [facility] prior to dispensing any controlled drugs.     Patient's diagnosis is consistent with otitis and complex ovarian cyst. Vital signs and exam are reassuring.  Chest x-ray negative for acute cardiopulmonary processes.  Lab work largely unremarkable.  Urinalysis shows some rare bacteria, likely due to contamination.  Ultrasound consistent with complex left ovarian cyst, likely hemorrhagic.  Patient appears well and is staying well hydrated.  Patient should alternate tylenol and ibuprofen for fever. Patient feels comfortable going home. Patient will be discharged home with prescriptions for azithromycin, prednisone, ibuprofen. Patient is to follow up with PCP as needed or otherwise directed. Patient is given ED precautions to return to the ED for any worsening or new symptoms.     ____________________________________________  FINAL CLINICAL IMPRESSION(S) / ED DIAGNOSES  Final diagnoses:  Bronchitis  Cyst of left ovary      NEW MEDICATIONS STARTED DURING THIS VISIT:  ED Discharge Orders         Ordered    azithromycin (ZITHROMAX Z-PAK) 250 MG tablet     02/07/18 2035    predniSONE (DELTASONE) 10 MG tablet     02/07/18 2035    ibuprofen (ADVIL,MOTRIN) 600 MG tablet  Every  6 hours PRN     02/07/18 2035              This chart was dictated using voice recognition software/Dragon. Despite best efforts to proofread, errors can occur which can change the meaning. Any change was purely unintentional.    Enid Derry, PA-C 02/07/18 2121    Willy Eddy, MD 02/07/18 2212

## 2018-10-14 ENCOUNTER — Ambulatory Visit
Admission: EM | Admit: 2018-10-14 | Discharge: 2018-10-14 | Disposition: A | Discharge status: Home / Self Care | Payer: Managed Care, Other (non HMO) | Attending: Urgent Care | Admitting: Urgent Care

## 2018-10-14 ENCOUNTER — Other Ambulatory Visit: Payer: Self-pay

## 2018-10-14 ENCOUNTER — Encounter: Payer: Self-pay | Admitting: Emergency Medicine

## 2018-10-14 DIAGNOSIS — J301 Allergic rhinitis due to pollen: Secondary | ICD-10-CM

## 2018-10-14 DIAGNOSIS — R42 Dizziness and giddiness: Secondary | ICD-10-CM

## 2018-10-14 DIAGNOSIS — Z20828 Contact with and (suspected) exposure to other viral communicable diseases: Secondary | ICD-10-CM

## 2018-10-14 DIAGNOSIS — G43009 Migraine without aura, not intractable, without status migrainosus: Secondary | ICD-10-CM | POA: Diagnosis not present

## 2018-10-14 DIAGNOSIS — Z20822 Contact with and (suspected) exposure to covid-19: Secondary | ICD-10-CM

## 2018-10-14 HISTORY — DX: Migraine, unspecified, not intractable, without status migrainosus: G43.909

## 2018-10-14 MED ORDER — FLUTICASONE PROPIONATE 50 MCG/ACT NA SUSP: 1.0000 | Freq: Every day | NASAL | 0 refills | Status: DC

## 2018-10-14 MED ORDER — DEXAMETHASONE SODIUM PHOSPHATE 10 MG/ML IJ SOLN
10.0000 mg | Freq: Once | INTRAMUSCULAR | Status: AC
  Administered 2018-10-14: 17:00:00 10 mg via INTRAMUSCULAR

## 2018-10-14 MED ORDER — CETIRIZINE HCL 10 MG PO TABS: 10.0000 mg | ORAL_TABLET | Freq: Every day | ORAL | 0 refills | Status: DC

## 2018-10-14 MED ORDER — ONDANSETRON 4 MG PO TBDP
4.0000 mg | ORAL_TABLET | Freq: Once | ORAL | Status: AC
  Administered 2018-10-14: 17:00:00 4 mg via ORAL

## 2018-10-14 NOTE — Discharge Instructions (Addendum)
It was very nice seeing you today in clinic. Thank you for entrusting me with your care.   Please utilize the medications that we discussed. Your prescriptions have been called in to your pharmacy. Increase fluid intake as much as possible. Try to incorporate electrolyte enriched fluids, such as Gatorade or Pedialyte, into your daily fluid intake.  You were swabbed for COVID today. Results have been taking 3-7 days to come back. Please quarantine at home until negative results have been received (per [place] DHHS guidelines).   Make arrangements to follow up with your regular doctor in 1 week for re-evaluation if not improving. If your symptoms/condition worsens, please seek follow up care either here or in the ER. Please remember, our [place] providers are "right here with you" when you need [place].   Again, it was my pleasure to take care of you today. Thank you for choosing our clinic. I hope that you start to feel better quickly.   Honor Loh, MSN, APRN, FNP-C, CEN Advanced Practice Provider [facility]

## 2018-10-14 NOTE — ED Triage Notes (Signed)
Patient c/o dizziness and nausea that started this morning. She states she has had a migraine x 1 month. Patient reports sneezing and nasal congestion 3-4 years

## 2018-10-14 NOTE — ED Provider Notes (Signed)
[place], [place]   Name: Tammy Hansen DOB: 1976-03-07 MRN: [id number] CSN: [id number] PCP: Pricilla HolmSharpe, Leslie M, MD  Arrival date and time:  10/14/18 1601  Chief Complaint:  Dizziness, Nausea, and Migraine   NOTE: Prior to seeing the patient today, I have reviewed the triage nursing documentation and vital signs. Clinical staff has updated patient's PMH/PSHx, current medication list, and drug allergies/intolerances to ensure comprehensive history available to assist in medical decision making.   History:   HPI: Tammy EstelleLillian I Burnham is a 43 y.o. female who presents today with complaints of a migraine headache that she has had, off and on, for the last month. She has no preceding aura. Headache similar to previous. Patient notes that when she was getting ready for work today, she became lightheaded and nauseated. Despite her symptoms, patient has not taken any over the counter interventions to help improve/relieve her headache at home.    Patient presets today with concerns following recent direct exposure to SARS-CoV-2 (novel coronavirus). Known exposure reported to have occurred "about a week ago". Patient is employed by [employer] where "a big cluster" of people have recently tested for SARS-CoV-2.  Patient denies fevers, dysgeusia, and dysosmia. Patient has issues with seasonal allergies that cause her to have chronic cough and congestion. Patient intermittently uses allergy medications. Patient presents for testing out of concern for her personal health in the midst of current pandemic conditions. She adds that she is is being required to provide documentation of negative test results before she will be allowed to return to work.    Past Medical History:  Diagnosis Date  . Asthma   . Migraines     Past Surgical History:  Procedure Laterality Date  . CESAREAN SECTION    . Tendons replaced    . TONSILLECTOMY      Family History  Problem Relation Age of Onset  . Hyperlipidemia Mother    . Diabetes Mother   . Heart failure Mother     Social History   Tobacco Use  . Smoking status: Never Smoker  . Smokeless tobacco: Never Used  Substance Use Topics  . Alcohol use: Yes    Alcohol/week: 1.0 standard drinks    Types: 1 Standard drinks or equivalent per week  . Drug use: No    There are no active problems to display for this patient.   Home Medications:    No outpatient medications have been marked as taking for the 10/14/18 encounter [facility](Hospital Encounter).    Allergies:   Morphine and related and Mucinex [guaifenesin er]  Review of Systems (ROS): Review of Systems  Constitutional: Negative for fatigue and fever.  HENT: Positive for congestion and rhinorrhea. Negative for ear pain, postnasal drip, sinus pressure, sinus pain, sneezing and sore throat.   Eyes: Negative for pain, discharge and redness.  Respiratory: Positive for cough (chronic). Negative for chest tightness and shortness of breath.   Cardiovascular: Negative for chest pain and palpitations.  Gastrointestinal: Negative for abdominal pain, diarrhea, nausea and vomiting.  Musculoskeletal: Negative for arthralgias, back pain, myalgias and neck pain.  Skin: Negative for color change, pallor and rash.  Neurological: Positive for light-headedness and headaches. Negative for dizziness, syncope and weakness.  Hematological: Negative for adenopathy.     Vital Signs: Today's Vitals   10/14/18 1634 10/14/18 1635 10/14/18 1640 10/14/18 1745  BP:   (!) 126/94   Pulse:   75   Resp:   18   Temp:   98.1 F (36.7  C)   TempSrc:   Oral   SpO2:   99%   Weight:  205 lb (93 kg)    Height:  5\' 3"  (1.6 m)    PainSc: 6    1     Physical Exam: Physical Exam  Constitutional: She is oriented to person, place, and time and well-developed, well-nourished, and in no distress.  HENT:  Head: Normocephalic and atraumatic.  Right Ear: Hearing, external ear and ear canal normal. No tenderness. Tympanic membrane is not  injected and not bulging. A middle ear effusion (mild) is present.  Left Ear: Hearing, external ear and ear canal normal. No tenderness. Tympanic membrane is not injected and not bulging. A middle ear effusion (mild) is present.  Nose: Mucosal edema and rhinorrhea present. No sinus tenderness.  Mouth/Throat: Uvula is midline, oropharynx is clear and moist and mucous membranes are normal. No oropharyngeal exudate, posterior oropharyngeal edema or posterior oropharyngeal erythema.  Eyes: Pupils are equal, round, and reactive to light. EOM are normal.  Neck: Normal range of motion. Neck supple. No tracheal deviation present.  Cardiovascular: Normal rate, regular rhythm, normal heart sounds and intact distal pulses. Exam reveals no gallop and no friction rub.  No murmur heard. Pulmonary/Chest: Effort normal and breath sounds normal. No respiratory distress. She has no wheezes. She has no rales.  Lymphadenopathy:    She has no cervical adenopathy.  Neurological: She is alert and oriented to person, place, and time. She has normal sensation, normal strength, normal reflexes and intact cranial nerves. Gait normal. GCS score is 15.  Skin: Skin is warm and dry. No rash noted.  Psychiatric: Mood, memory, affect and judgment normal.  Nursing note and vitals reviewed.   Urgent Care Treatments / Results:   LABS: PLEASE NOTE: all labs that were ordered this encounter are listed, however only abnormal results are displayed. Labs Reviewed  NOVEL CORONAVIRUS, NAA (HOSPITAL ORDER, SEND-OUT TO REF LAB)    EKG: -None  RADIOLOGY: No results found.  PROCEDURES: Procedures  MEDICATIONS RECEIVED THIS VISIT: Medications  dexamethasone (DECADRON) injection 10 mg (10 mg Intramuscular Given 10/14/18 1710)  ondansetron (ZOFRAN-ODT) disintegrating tablet 4 mg (4 mg Oral Given 10/14/18 1710)    PERTINENT CLINICAL COURSE NOTES/UPDATES: Clinical Course as of Oct 14 2134  Thu Oct 14, 2018  1730 Patient feeling  better after clinic interventions. Headache is minimal and rated 1/10 at this point. Will proceed with discharge.    [BG]    Clinical Course User Index [BG] Karen Kitchens, NP   Initial Impression / Assessment and Plan / Urgent Care Course:  Pertinent labs & imaging results that were available during my care of the patient were personally reviewed by me and considered in my medical decision making (see lab/imaging section of note for values and interpretations).  JAMYLA ARD is a 43 y.o. female who presents to [facility] today with complaints of Dizziness, Nausea, and Migraine   Patient is well appearing overall in clinic today. She does not appear to be in any acute distress. Presenting symptoms (see HPI) and exam as documented above. Headache and nausea improved following IM dexamethasone and ODT ondansetron; pain reduced form 6/10 to 1/10. Suspect that allergies are playing into patient's presenting symptoms, including her headache. Will start patient back on daily allergy medication using cetirizine and fluticasone. She was advised to make efforts to increase her fluid intake to help further with her headache and dizziness. Patient declines need for further antiemetics at home.  May use APAP and/or IBU as needed for recurrent headache.   Patient presents following a direct exposure to SARS-CoV-2 (novel coronavirus) while at work. Discussed typical symptom constellation. Reviewed potential for infection with recent close contact. Given exposure and potential for infection, testing is reasonable. Patient collected SARS-CoV-2 via facility approved self swab process today under the supervision of certified clinical staff. Discussed variable turn around times associated with testing, as swabs are being processed at [facility], and have been taking as long as 7 days. She was advised to self quarantine, per [facility] DH[facility] guidelines, until negative results received.   Current clinical condition  warrants patient being out of work in order to recover from her current injury/illness. She was provided with the appropriate documentation to provide to her place of employment that will allow for her to RTW on 10/16/2018 with no restrictions as long as negative SARS-CoV-2 results have been received.   Discussed follow up with primary care physician in 1 week for re-evaluation. I have reviewed the follow up and strict return precautions for any new or worsening symptoms. Patient is aware of symptoms that would be deemed urgent/emergent, and would thus require further evaluation either here or in the emergency department. At the time of discharge, she verbalized understanding and consent with the discharge plan as it was reviewed with her. All questions were fielded by provider and/or clinic staff prior to patient discharge.    Final Clinical Impressions / Urgent Care Diagnoses:   Final diagnoses:  Migraine without aura and without status migrainosus, not intractable  Allergic rhinitis due to pollen, unspecified seasonality  Dizziness  Exposure to Covid-19 Virus    New Prescriptions:  [place] Controlled Substance Registry consulted? Not Applicable  Meds ordered this encounter  Medications  . dexamethasone (DECADRON) injection 10 mg  . ondansetron (ZOFRAN-ODT) disintegrating tablet 4 mg  . fluticasone (FLONASE) 50 MCG/ACT nasal spray    Sig: Place 1 spray into both nostrils daily.    Dispense:  16 g    Refill:  0  . cetirizine (ZYRTEC ALLERGY) 10 MG tablet    Sig: Take 1 tablet (10 mg total) by mouth daily.    Dispense:  30 tablet    Refill:  0    Recommended Follow up Care:  Patient encouraged to follow up with the following provider within the specified time frame, or sooner as dictated by the severity of her symptoms. As always, she was instructed that for any urgent/emergent care needs, she should seek care either here or in the emergency department for more immediate evaluation.   Follow-up Information    Pricilla HolmSharpe, Leslie M, MD In 1 week.   Specialty: Family Medicine Contact information: [address] [place] [address] [phone]         NOTE: This note was prepared using Dragon dictation software along with smaller phrase technology. Despite my best ability to proofread, there is the potential that transcriptional errors may still occur from this process, and are completely unintentional.     Verlee MonteGray, Jeannemarie Sawaya E, NP 10/14/18 2142

## 2018-10-15 ENCOUNTER — Encounter (HOSPITAL_COMMUNITY): Payer: Self-pay

## 2018-10-15 LAB — NOVEL CORONAVIRUS, NAA (HOSP ORDER, SEND-OUT TO REF LAB; TAT 18-24 HRS): SARS-CoV-2, NAA: NOT DETECTED

## 2019-01-09 ENCOUNTER — Encounter: Payer: Self-pay | Admitting: Emergency Medicine

## 2019-01-09 ENCOUNTER — Emergency Department: Payer: BC Managed Care – PPO

## 2019-01-09 ENCOUNTER — Other Ambulatory Visit: Payer: Self-pay

## 2019-01-09 ENCOUNTER — Emergency Department
Admission: EM | Admit: 2019-01-09 | Discharge: 2019-01-09 | Disposition: A | Discharge status: Home / Self Care | Payer: BC Managed Care – PPO | Attending: Emergency Medicine | Admitting: Emergency Medicine

## 2019-01-09 DIAGNOSIS — M545 Low back pain, unspecified: Secondary | ICD-10-CM

## 2019-01-09 DIAGNOSIS — R110 Nausea: Secondary | ICD-10-CM | POA: Insufficient documentation

## 2019-01-09 DIAGNOSIS — M5137 Other intervertebral disc degeneration, lumbosacral region: Secondary | ICD-10-CM

## 2019-01-09 DIAGNOSIS — R109 Unspecified abdominal pain: Secondary | ICD-10-CM | POA: Diagnosis present

## 2019-01-09 DIAGNOSIS — K8020 Calculus of gallbladder without cholecystitis without obstruction: Secondary | ICD-10-CM | POA: Insufficient documentation

## 2019-01-09 DIAGNOSIS — J45909 Unspecified asthma, uncomplicated: Secondary | ICD-10-CM | POA: Insufficient documentation

## 2019-01-09 DIAGNOSIS — Z79899 Other long term (current) drug therapy: Secondary | ICD-10-CM | POA: Insufficient documentation

## 2019-01-09 DIAGNOSIS — M5136 Other intervertebral disc degeneration, lumbar region: Secondary | ICD-10-CM | POA: Diagnosis not present

## 2019-01-09 LAB — BASIC METABOLIC PANEL
Anion gap: 8 (ref 5–15)
BUN: 15 mg/dL (ref 6–20)
CO2: 26 mmol/L (ref 22–32)
Calcium: 9.2 mg/dL (ref 8.9–10.3)
Chloride: 104 mmol/L (ref 98–111)
Creatinine, Ser: 0.47 mg/dL (ref 0.44–1.00)
GFR calc Af Amer: >60 mL/min (ref >60)
GFR calc non Af Amer: >60 mL/min (ref >60)
Glucose, Bld: 144 mg/dL — ABNORMAL HIGH (ref 70–99)
Potassium: 4.6 mmol/L (ref 3.5–5.1)
Sodium: 138 mmol/L (ref 135–145)

## 2019-01-09 LAB — CBC
HCT: 38.3 % (ref 36.0–46.0)
Hemoglobin: 12.4 g/dL (ref 12.0–15.0)
MCH: 27.7 pg (ref 26.0–34.0)
MCHC: 32.4 g/dL (ref 30.0–36.0)
MCV: 85.7 fL (ref 80.0–100.0)
Platelets: 265 10*3/uL (ref 150–400)
RBC: 4.47 MIL/uL (ref 3.87–5.11)
RDW: 12.7 % (ref 11.5–15.5)
WBC: 7.8 10*3/uL (ref 4.0–10.5)
nRBC: 0 % (ref 0.0–0.2)

## 2019-01-09 LAB — URINALYSIS, COMPLETE (UACMP) WITH MICROSCOPIC
Bacteria, UA: NONE SEEN
Bilirubin Urine: NEGATIVE
Glucose, UA: NEGATIVE mg/dL
Hgb urine dipstick: NEGATIVE
Ketones, ur: NEGATIVE mg/dL
Leukocytes,Ua: NEGATIVE
Nitrite: NEGATIVE
Protein, ur: NEGATIVE mg/dL
Specific Gravity, Urine: 1.024 (ref 1.005–1.030)
pH: 5 (ref 5.0–8.0)

## 2019-01-09 LAB — PREGNANCY, URINE: Preg Test, Ur: NEGATIVE

## 2019-01-09 LAB — POCT PREGNANCY, URINE: Preg Test, Ur: NEGATIVE

## 2019-01-09 MED ORDER — PREDNISONE 10 MG PO TABS: ORAL_TABLET | ORAL | 0 refills | Status: DC

## 2019-01-09 MED ORDER — HYDROCODONE-ACETAMINOPHEN 5-325 MG PO TABS: 1.0000 | ORAL_TABLET | Freq: Four times a day (QID) | ORAL | 0 refills | Status: DC | PRN

## 2019-01-09 MED ORDER — ONDANSETRON 4 MG PO TBDP
4.0000 mg | ORAL_TABLET | Freq: Once | ORAL | Status: AC
  Administered 2019-01-09: 4 mg via ORAL
  Filled 2019-01-09: qty 1

## 2019-01-09 MED ORDER — ONDANSETRON 4 MG PO TBDP: 4.0000 mg | ORAL_TABLET | Freq: Three times a day (TID) | ORAL | 0 refills | Status: DC | PRN

## 2019-01-09 MED ORDER — HYDROCODONE-ACETAMINOPHEN 5-325 MG PO TABS
2.0000 | ORAL_TABLET | Freq: Once | ORAL | Status: AC
  Administered 2019-01-09: 2 via ORAL
  Filled 2019-01-09: qty 2

## 2019-01-09 NOTE — ED Provider Notes (Signed)
[facility] Emergency Department Provider Note   ____________________________________________   First MD Initiated Contact with Patient 01/09/19 0745     (approximate)  I have reviewed the triage vital signs and the nursing notes.   HISTORY  Chief Complaint Flank Pain   HPI Tammy Hansen is a 43 y.o. female presents to the ED with complaint of right flank pain that began yesterday.  Patient states she does not have a history of kidney stones and states that the pain is worsening especially with movement.  Patient does have a history of degenerative disc disease but she is unsure if this is what is causing her pain.  She denies any dysuria, hematuria or vaginal discharge.  She does report nausea.  She rates her pain as a 10/10.     Past Medical History:  Diagnosis Date  . Asthma   . Migraines     There are no active problems to display for this patient.   Past Surgical History:  Procedure Laterality Date  . CESAREAN SECTION    . Tendons replaced    . TONSILLECTOMY      Prior to Admission medications   Medication Sig Start Date End Date Taking? Authorizing Provider  cetirizine (ZYRTEC ALLERGY) 10 MG tablet Take 1 tablet (10 mg total) by mouth daily. 10/14/18   Karen Kitchens, NP  fluticasone (FLONASE) 50 MCG/ACT nasal spray Place 1 spray into both nostrils daily. 10/14/18   Karen Kitchens, NP  HYDROcodone-acetaminophen (NORCO/VICODIN) 5-325 MG tablet Take 1-2 tablets by mouth every 6 (six) hours as needed. 01/09/19   Johnn Hai, PA-C  ondansetron (ZOFRAN ODT) 4 MG disintegrating tablet Take 1 tablet (4 mg total) by mouth every 8 (eight) hours as needed for nausea or vomiting. 01/09/19   Johnn Hai, PA-C  predniSONE (DELTASONE) 10 MG tablet Take 6 tablets  today, on day 2 take 5 tablets, day 3 take 4 tablets, day 4 take 3 tablets, day 5 take  2 tablets and 1 tablet the last day 01/09/19   Johnn Hai, PA-C    Allergies Morphine and  related and Mucinex [guaifenesin er]  Family History  Problem Relation Age of Onset  . Hyperlipidemia Mother   . Diabetes Mother   . Heart failure Mother     Social History Social History   Tobacco Use  . Smoking status: Never Smoker  . Smokeless tobacco: Never Used  Substance Use Topics  . Alcohol use: Yes    Alcohol/week: 1.0 standard drinks    Types: 1 Standard drinks or equivalent per week  . Drug use: No    Review of Systems Constitutional: No fever/chills Cardiovascular: Denies chest pain. Respiratory: Denies shortness of breath. Gastrointestinal: No abdominal pain.  Positive nausea, no vomiting.  Genitourinary: Negative for dysuria. Musculoskeletal: Positive for right back pain. Skin: Negative for rash. Neurological: Negative for headaches, focal weakness or numbness. ____________________________________________   PHYSICAL EXAM:  VITAL SIGNS: ED Triage Vitals  Enc Vitals Group     BP 01/09/19 0403 128/66     Pulse Rate 01/09/19 0403 85     Resp --      Temp 01/09/19 0403 97.6 F (36.4 C)     Temp Source 01/09/19 0403 Oral     SpO2 01/09/19 0403 97 %     Weight 01/09/19 0403 207 lb (93.9 kg)     Height 01/09/19 0403 5\' 4"  (1.626 m)     Head Circumference --  Peak Flow --      Pain Score 01/09/19 0407 10     Pain Loc --      Pain Edu? --      Excl. in GC? --     Constitutional: Alert and oriented. Well appearing and in no acute distress. Eyes: Conjunctivae are normal.  Head: Atraumatic. Neck: No stridor.   Cardiovascular: Normal rate, regular rhythm. Grossly normal heart sounds.  Good peripheral circulation. Respiratory: Normal respiratory effort.  No retractions. Lungs CTAB. Gastrointestinal: Soft and nontender. No distention.  No CVA tenderness. Musculoskeletal: Examination of the back there is no gross deformity however range of motion is slow and guarded secondary to pain.  Patient does have moderate tenderness on palpation of the the lower  lumbar and sacral area more on the right than the left.  No point tenderness on palpation of the vertebral bodies noted.  Good muscle strength bilaterally.  Patient is ambulatory without any assistance. Neurologic:  Normal speech and language. No gross focal neurologic deficits are appreciated.  Reflexes 2+ bilaterally.  No gait instability. Skin:  Skin is warm, dry and intact. No rash noted. Psychiatric: Mood and affect are normal. Speech and behavior are normal.  ____________________________________________   LABS (all labs ordered are listed, but only abnormal results are displayed)  Labs Reviewed  URINALYSIS, COMPLETE (UACMP) WITH MICROSCOPIC - Abnormal; Notable for the following components:      Result Value   Color, Urine YELLOW (*)    APPearance CLEAR (*)    All other components within normal limits  BASIC METABOLIC PANEL - Abnormal; Notable for the following components:   Glucose, Bld 144 (*)    All other components within normal limits  CBC  PREGNANCY, URINE  POCT PREGNANCY, URINE    RADIOLOGY  Official radiology report(s): Ct Renal Stone Study  Result Date: 01/09/2019 CLINICAL DATA:  Right flank pain since yesterday. EXAM: CT ABDOMEN AND PELVIS WITHOUT CONTRAST TECHNIQUE: Multidetector CT imaging of the abdomen and pelvis was performed following the standard protocol without IV contrast. COMPARISON:  03/09/2017. FINDINGS: Lower chest: Unremarkable. Hepatobiliary: Multiple small calculi in the dependent portion of the gallbladder, measuring approximately 2 mm in maximum diameter each. No gallbladder wall thickening or pericholecystic fluid. Normal appearing liver. Pancreas: Unremarkable. No pancreatic ductal dilatation or surrounding inflammatory changes. Spleen: Normal in size without focal abnormality. Adrenals/Urinary Tract: Adrenal glands are unremarkable. Kidneys are normal, without renal calculi, focal lesion, or hydronephrosis. Bladder is unremarkable. Stomach/Bowel:  Stomach is within normal limits. Appendix appears normal. No evidence of bowel wall thickening, distention, or inflammatory changes. Vascular/Lymphatic: No significant vascular findings are present. No enlarged abdominal or pelvic lymph nodes. Reproductive: Uterus and bilateral adnexa are unremarkable. Other: Small umbilical hernia containing fat. Musculoskeletal: Mild degenerative changes at the L5-S1 level. IMPRESSION: 1. No acute abnormality. 2. Cholelithiasis. Electronically Signed   By: Beckie SaltsSteven  Reid M.D.   On: 01/09/2019 05:29    ____________________________________________   PROCEDURES  Procedure(s) performed (including Critical Care):  Procedures   ____________________________________________   INITIAL IMPRESSION / ASSESSMENT AND PLAN / ED COURSE  As part of my medical decision making, I reviewed the following data within the electronic MEDICAL RECORD NUMBER Notes from prior ED visits and [place] Controlled Substance Database  43 year old female presents to the ED with complaint of right lower back pain without history of dysuria or hematuria.  Patient labs were unremarkable.  CT renal study showed no kidney stones but did show multiple stones  2 mm cholelithiasis without  cholecystitis.  There was also noted degenerative changes on L5-S1 area.  Patient was given Zofran and Norco while in the ED.  A prescription for prednisone taper was sent to the pharmacy along with continued Zofran and Norco as needed.  She is to follow-up with her PCP if any continued problems.  She was also made aware of her gallstones and that if a continued fatty diet causes any difficulty she may in the future need to see a Careers adviser.  ____________________________________________   FINAL CLINICAL IMPRESSION(S) / ED DIAGNOSES  Final diagnoses:  Acute right-sided low back pain without sciatica  Cholelithiasis without cholecystitis  Degenerative disc disease at L5-S1 level     ED Discharge Orders         Ordered     ondansetron (ZOFRAN ODT) 4 MG disintegrating tablet  Every 8 hours PRN     01/09/19 0924    HYDROcodone-acetaminophen (NORCO/VICODIN) 5-325 MG tablet  Every 6 hours PRN     01/09/19 0924    predniSONE (DELTASONE) 10 MG tablet     01/09/19 4010           Note:  This document was prepared using Dragon voice recognition software and may include unintentional dictation errors.    Tommi Rumps, PA-C 01/09/19 1241    Chesley Noon, MD 01/09/19 [phone]

## 2019-01-09 NOTE — ED Triage Notes (Signed)
Pt here with c/o right flank pain that began yesterday, no hx of kidney stones, states the pain is worsening. Has degenerative disc disease, however, pt is unsure where the pain is from. Denies burning with urination or blood in urine. NAD. Tearful in triage.

## 2019-01-09 NOTE — Discharge Instructions (Addendum)
Follow-up with your primary care provider if any continued problems.  Begin taking Norco as directed every 6 hours as needed for pain.  Zofran as needed for nausea or vomiting.  The prednisone you have taken in the past.  Begin taking that as directed and starting with 6 tablets today and decreasing by 1 tablet every day until finished.  You may use ice or heat to your back as needed for discomfort.  If any severe worsening of your symptoms return to the emergency department.  You do have gallstones which are very small.  You should make your primary care provider aware of this.  Also decrease the amount of fried, greasy, fatty foods that you are eating as this may cause problems with your gallbladder in the future.  As your stones enlarged it may be necessary for you to see a general surgeon to have gallbladder surgery.

## 2019-01-24 DIAGNOSIS — K8020 Calculus of gallbladder without cholecystitis without obstruction: Secondary | ICD-10-CM

## 2019-01-24 HISTORY — DX: Calculus of gallbladder without cholecystitis without obstruction: K80.20

## 2019-03-30 ENCOUNTER — Ambulatory Visit
Admission: EM | Admit: 2019-03-30 | Discharge: 2019-03-30 | Disposition: A | Discharge status: Home / Self Care | Payer: BC Managed Care – PPO | Attending: Family Medicine | Admitting: Family Medicine

## 2019-03-30 ENCOUNTER — Other Ambulatory Visit: Payer: Self-pay

## 2019-03-30 DIAGNOSIS — J0100 Acute maxillary sinusitis, unspecified: Secondary | ICD-10-CM | POA: Insufficient documentation

## 2019-03-30 DIAGNOSIS — Z20822 Contact with and (suspected) exposure to covid-19: Secondary | ICD-10-CM | POA: Diagnosis not present

## 2019-03-30 HISTORY — DX: Other cervical disc degeneration, unspecified cervical region: M50.30

## 2019-03-30 HISTORY — DX: Other intervertebral disc degeneration, lumbar region: M51.36

## 2019-03-30 HISTORY — DX: Other intervertebral disc degeneration, lumbar region without mention of lumbar back pain or lower extremity pain: M51.369

## 2019-03-30 MED ORDER — AMOXICILLIN-POT CLAVULANATE 875-125 MG PO TABS: 1.0000 | ORAL_TABLET | Freq: Two times a day (BID) | ORAL | 0 refills | Status: DC

## 2019-03-30 NOTE — Discharge Instructions (Signed)
No work until [employer] returns.  Antibiotic as prescribed.  Take care  Dr. Adriana Simas

## 2019-03-30 NOTE — ED Provider Notes (Signed)
[facility]    CSN: [id number] Arrival date & time: 03/30/19  1539  History   Chief Complaint Chief Complaint  Patient presents with  . Nasal Congestion   HPI  44 year old female presents with the above complaints.  Patient states that she has been sick since 1/1.  She has had ongoing sinus pressure and congestion.  She is also had rhinorrhea.  No recent fever.  She has had exposure to Covid to be positive coworkers.  Patient believes that she has a sinus infection.  She also needs Covid testing.  Denies cough, fever, chills, shortness of breath.  She has been using Alka-Seltzer sinus with some improvement.  No known exacerbating factors.  No other reported symptoms.  No other complaints.  PMH, Surgical Hx, Family Hx, Social History reviewed and updated as below.  Past Medical History:  Diagnosis Date  . Asthma   . Degenerative disc disease, cervical   . Degenerative disc disease, lumbar   . Migraines    Past Surgical History:  Procedure Laterality Date  . CESAREAN SECTION    . Tendons replaced    . TONSILLECTOMY     OB History    Gravida  9   Para  3   Term  3   Preterm      AB      Living  3     SAB      TAB      Ectopic      Multiple      Live Births             Home Medications    Prior to Admission medications   Medication Sig Start Date End Date Taking? Authorizing Provider  cetirizine (ZYRTEC ALLERGY) 10 MG tablet Take 1 tablet (10 mg total) by mouth daily. 10/14/18  Yes Verlee Monte, NP  fluticasone (FLONASE) 50 MCG/ACT nasal spray Place 1 spray into both nostrils daily. 10/14/18  Yes Verlee Monte, NP  ondansetron (ZOFRAN ODT) 4 MG disintegrating tablet Take 1 tablet (4 mg total) by mouth every 8 (eight) hours as needed for nausea or vomiting. 01/09/19  Yes Bridget Hartshorn L, PA-C  amoxicillin-clavulanate (AUGMENTIN) 875-125 MG tablet Take 1 tablet by mouth every 12 (twelve) hours. 03/30/19   Tommie Sams, DO   Family  History Family History  Problem Relation Age of Onset  . Hyperlipidemia Mother   . Diabetes Mother   . Heart failure Mother    Social History Social History   Tobacco Use  . Smoking status: Never Smoker  . Smokeless tobacco: Never Used  Substance Use Topics  . Alcohol use: Yes    Alcohol/week: 1.0 standard drinks    Types: 1 Standard drinks or equivalent per week  . Drug use: No    Allergies   Morphine and related and Mucinex [guaifenesin er]   Review of Systems Review of Systems  Constitutional: Negative for fever.  HENT: Positive for congestion, rhinorrhea and sinus pressure.    Physical Exam Triage Vital Signs ED Triage Vitals  Enc Vitals Group     BP 03/30/19 1601 103/68     Pulse Rate 03/30/19 1601 85     Resp --      Temp 03/30/19 1601 97.6 F (36.4 C)     Temp Source 03/30/19 1601 Oral     SpO2 03/30/19 1601 100 %     Weight 03/30/19 1557 207 lb (93.9 kg)     Height 03/30/19  1557 5\' 4"  (1.626 m)     Head Circumference --      Peak Flow --      Pain Score 03/30/19 1556 6     Pain Loc --      Pain Edu? --      Excl. in [place]? --    Updated Vital Signs BP 103/68 (BP Location: Left Arm)   Pulse 85   Temp 97.6 F (36.4 C) (Oral)   Ht 5\' 4"  (1.626 m)   Wt 93.9 kg   LMP 03/02/2019   SpO2 100%   BMI 35.53 kg/m   Visual Acuity Right Eye Distance:   Left Eye Distance:   Bilateral Distance:    Right Eye Near:   Left Eye Near:    Bilateral Near:     Physical Exam Constitutional:      General: She is not in acute distress.    Appearance: Normal appearance. She is obese. She is not ill-appearing.  HENT:     Head: Normocephalic and atraumatic.     Nose: Congestion present.     Mouth/Throat:     Pharynx: Oropharynx is clear. No oropharyngeal exudate.  Eyes:     General:        Right eye: No discharge.        Left eye: No discharge.     Conjunctiva/sclera: Conjunctivae normal.  Cardiovascular:     Rate and Rhythm: Normal rate and regular rhythm.   Pulmonary:     Effort: Pulmonary effort is normal.     Breath sounds: Normal breath sounds. No wheezing, rhonchi or rales.  Neurological:     Mental Status: She is alert.  Psychiatric:        Mood and Affect: Mood normal.        Behavior: Behavior normal.    UC Treatments / Results  Labs (all labs ordered are listed, but only abnormal results are displayed) Labs Reviewed  NOVEL CORONAVIRUS, NAA (HOSP ORDER, SEND-OUT TO REF LAB; TAT 18-24 HRS)    EKG   Radiology No results found.  Procedures Procedures (including critical care time)  Medications Ordered in UC Medications - No data to display  Initial Impression / Assessment and Plan / UC Course  I have reviewed the triage vital signs and the nursing notes.  Pertinent labs & imaging results that were available during my care of the patient were reviewed by me and considered in my medical decision making (see chart for details).     44 year old female presents with sinusitis.  Acute uncomplicated illness.  Possible COVID-19.  Awaiting test result.  Treating with Augmentin.  Final Clinical Impressions(s) / UC Diagnoses   Final diagnoses:  Acute maxillary sinusitis, recurrence not specified  Encounter for laboratory testing for COVID-19 virus     Discharge Instructions     No work until [employer] test returns.  Antibiotic as prescribed.  Take care  Dr. Lacinda Axon     ED Prescriptions    Medication Sig Dispense Auth. Provider   amoxicillin-clavulanate (AUGMENTIN) 875-125 MG tablet Take 1 tablet by mouth every 12 (twelve) hours. 14 tablet Coral Spikes, DO     PDMP not reviewed this encounter.   Coral Spikes, [place] 03/30/19 1709

## 2019-03-30 NOTE — ED Triage Notes (Addendum)
Pt presents with c/o recent sinus infection, she states symptoms present since 03/11/19. 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 She states she did not come in sooner because she was so congested in her nasal passages and assumed we could not test her due to this. Pt denies cough, fever/chills, shob or other symptoms.

## 2019-03-31 LAB — NOVEL CORONAVIRUS, NAA (HOSP ORDER, SEND-OUT TO REF LAB; TAT 18-24 HRS): SARS-CoV-2, NAA: NOT DETECTED

## 2019-06-06 ENCOUNTER — Other Ambulatory Visit: Payer: Self-pay

## 2019-06-06 ENCOUNTER — Ambulatory Visit: Payer: BC Managed Care – PPO | Attending: Internal Medicine

## 2019-06-06 DIAGNOSIS — Z23 Encounter for immunization: Secondary | ICD-10-CM

## 2019-06-06 NOTE — Progress Notes (Signed)
   Covid-19 Vaccination Clinic  Name:  Tammy Hansen    MRN: [id number] DOB: 1976-03-07  06/06/2019  [name] was observed post Covid-19 immunization for 15 minutes without incident. She was provided with Vaccine Information Sheet and instruction to access the V-Safe system.   [name] was instructed to call 911 with any severe reactions post vaccine: Marland Kitchen Difficulty breathing  . Swelling of face and throat  . A fast heartbeat  . A bad rash all over body  . Dizziness and weakness   Immunizations Administered    Name Date Dose VIS Date Route   Pfizer COVID-19 Vaccine 06/06/2019  2:18 PM 0.3 mL 02/18/2019 Intramuscular   Manufacturer: [employer], [employer]   Lot: [id number]   NDC: [address]-5

## 2019-06-22 DIAGNOSIS — E559 Vitamin D deficiency, unspecified: Secondary | ICD-10-CM | POA: Insufficient documentation

## 2019-06-22 HISTORY — DX: Vitamin D deficiency, unspecified: E55.9

## 2019-06-29 ENCOUNTER — Ambulatory Visit: Payer: BC Managed Care – PPO | Attending: Internal Medicine

## 2019-06-29 DIAGNOSIS — Z23 Encounter for immunization: Secondary | ICD-10-CM

## 2019-06-29 NOTE — Progress Notes (Signed)
   Covid-19 Vaccination Clinic  Name:  VERENIS NICOSIA    MRN: [id number] DOB: [birth year]  06/29/2019  Ms. Focht was observed post Covid-19 immunization for 15 minutes without incident. She was provided with Vaccine Information Sheet and instruction to access the V-Safe system.   Ms. Calkin was instructed to call 911 with any severe reactions post vaccine: Marland Kitchen Difficulty breathing  . Swelling of face and throat  . A fast heartbeat  . A bad rash all over body  . Dizziness and weakness   Immunizations Administered    Name Date Dose VIS Date Route   Pfizer COVID-19 Vaccine 06/29/2019  2:31 PM 0.3 mL 05/04/2018 Intramuscular   Manufacturer: [employer], [employer]   Lot: [id number]   NDC: [address]-5

## 2020-05-21 ENCOUNTER — Other Ambulatory Visit: Payer: Self-pay | Admitting: Family

## 2020-06-21 DIAGNOSIS — T2020XA Burn of second degree of head, face, and neck, unspecified site, initial encounter: Secondary | ICD-10-CM

## 2020-06-21 DIAGNOSIS — T22291A Burn of second degree of multiple sites of right shoulder and upper limb, except wrist and hand, initial encounter: Secondary | ICD-10-CM

## 2020-06-21 HISTORY — DX: Burn of second degree of head, face, and neck, unspecified site, initial encounter: T20.20XA

## 2020-06-21 HISTORY — DX: Burn of second degree of multiple sites of right shoulder and upper limb, except wrist and hand, initial encounter: T22.291A

## 2020-09-16 ENCOUNTER — Emergency Department
Admission: EM | Admit: 2020-09-16 | Discharge: 2020-09-16 | Disposition: A | Discharge status: Home / Self Care | Payer: BC Managed Care – PPO | Attending: Emergency Medicine | Admitting: Emergency Medicine

## 2020-09-16 ENCOUNTER — Other Ambulatory Visit: Payer: Self-pay

## 2020-09-16 ENCOUNTER — Encounter: Payer: Self-pay | Admitting: Emergency Medicine

## 2020-09-16 DIAGNOSIS — U071 COVID-19: Secondary | ICD-10-CM | POA: Diagnosis not present

## 2020-09-16 DIAGNOSIS — R590 Localized enlarged lymph nodes: Secondary | ICD-10-CM | POA: Insufficient documentation

## 2020-09-16 DIAGNOSIS — J45909 Unspecified asthma, uncomplicated: Secondary | ICD-10-CM | POA: Diagnosis not present

## 2020-09-16 DIAGNOSIS — R509 Fever, unspecified: Secondary | ICD-10-CM | POA: Diagnosis present

## 2020-09-16 DIAGNOSIS — R000 Tachycardia, unspecified: Secondary | ICD-10-CM | POA: Diagnosis not present

## 2020-09-16 LAB — RESP PANEL BY RT-PCR (FLU A&B, COVID) ARPGX2
Influenza A by PCR: NEGATIVE
Influenza B by PCR: NEGATIVE
SARS Coronavirus 2 by RT PCR: POSITIVE — AB

## 2020-09-16 NOTE — ED Triage Notes (Signed)
Pt reports that she developed a headache and bodyaches since Friday. She been tired and no appetite. She took a home COVID test and tested positive.

## 2020-09-16 NOTE — ED Notes (Signed)
Discharge instructions reviewed with pt, pt calm , collective

## 2020-09-16 NOTE — ED Provider Notes (Signed)
[facility] EMERGENCY DEPARTMENT Provider Note   CSN: [id number] Arrival date & time: 09/16/20  1719     History Chief Complaint  Patient presents with   Covid Positive    Tammy Hansen is a 45 y.o. female presents to the emergency department for evaluation of a fevers chills body aches, slight cough.  Symptoms been present for 2 days.  Overall symptoms have been improving.  She took a home test that was positive for COVID she is requesting PCR test today.  Patient denies any chest pain or shortness of breath.  She is tolerating p.o. well.  Others at her home are positive for COVID.  She has been vaccinated.  She states she has a slight cough that is improving mostly complains of a fatigue and body aches.  HPI     Past Medical History:  Diagnosis Date   Asthma    Degenerative disc disease, cervical    Degenerative disc disease, lumbar    Migraines     There are no problems to display for this patient.   Past Surgical History:  Procedure Laterality Date   CESAREAN SECTION     Tendons replaced     TONSILLECTOMY       OB History     Gravida  9   Para  3   Term  3   Preterm      AB      Living  3      SAB      IAB      Ectopic      Multiple      Live Births              Family History  Problem Relation Age of Onset   Hyperlipidemia Mother    Diabetes Mother    Heart failure Mother     Social History   Tobacco Use   Smoking status: Never   Smokeless tobacco: Never  Vaping Use   Vaping Use: Never used  Substance Use Topics   Alcohol use: Yes    Alcohol/week: 1.0 standard drink    Types: 1 Standard drinks or equivalent per week   Drug use: No    Home Medications Prior to Admission medications   Medication Sig Start Date End Date Taking? Authorizing Provider  amoxicillin-clavulanate (AUGMENTIN) 875-125 MG tablet Take 1 tablet by mouth every 12 (twelve) hours. 03/30/19   Tommie Sams, DO  cetirizine (ZYRTEC  ALLERGY) 10 MG tablet Take 1 tablet (10 mg total) by mouth daily. 10/14/18   Verlee Monte, NP  fluticasone (FLONASE) 50 MCG/ACT nasal spray Place 1 spray into both nostrils daily. 10/14/18   Verlee Monte, NP  ondansetron (ZOFRAN ODT) 4 MG disintegrating tablet Take 1 tablet (4 mg total) by mouth every 8 (eight) hours as needed for nausea or vomiting. 01/09/19   Tommi Rumps, PA-C    Allergies    Morphine and related and Mucinex [guaifenesin er]  Review of Systems   Review of Systems  Constitutional:  Positive for chills. Negative for fatigue and fever.  Respiratory:  Negative for shortness of breath.   Cardiovascular:  Negative for chest pain.  Gastrointestinal:  Negative for diarrhea, nausea and vomiting.  Musculoskeletal:  Positive for myalgias.  Skin:  Negative for rash and wound.  Neurological:  Negative for dizziness, light-headedness and headaches.   Physical Exam Updated Vital Signs BP 123/64 (BP Location: Left Arm)   Pulse (!) 116  Temp 98.2 F (36.8 C) (Oral)   Resp 20   Ht 5\' 3"  (1.6 m)   Wt 108.9 kg   SpO2 95%   BMI 42.51 kg/m   Physical Exam Constitutional:      General: She is not in acute distress.    Appearance: She is well-developed.  HENT:     Head: Normocephalic and atraumatic.     Jaw: No trismus.     Right Ear: Hearing and external ear normal.     Left Ear: Hearing and external ear normal.     Nose: Rhinorrhea present.     Mouth/Throat:     Pharynx: No oropharyngeal exudate, posterior oropharyngeal erythema or uvula swelling.     Tonsils: No tonsillar exudate or tonsillar abscesses.  Eyes:     General:        Right eye: No discharge.        Left eye: No discharge.     Conjunctiva/sclera: Conjunctivae normal.  Cardiovascular:     Rate and Rhythm: Regular rhythm. Tachycardia present.  Pulmonary:     Effort: Pulmonary effort is normal. No respiratory distress.     Breath sounds: Normal breath sounds. No stridor. No wheezing or rales.   Abdominal:     General: There is no distension.     Palpations: Abdomen is soft.     Tenderness: There is no abdominal tenderness.  Musculoskeletal:        General: No deformity. Normal range of motion.     Cervical back: Normal range of motion.  Lymphadenopathy:     Cervical: Cervical adenopathy present.  Skin:    General: Skin is warm and dry.  Neurological:     Mental Status: She is alert and oriented to person, place, and time.     Deep Tendon Reflexes: Reflexes are normal and symmetric.  Psychiatric:        Behavior: Behavior normal.        Thought Content: Thought content normal.    ED Results / Procedures / Treatments   Labs (all labs ordered are listed, but only abnormal results are displayed) Labs Reviewed  RESP PANEL BY RT-PCR (FLU A&B, COVID) [id number]    EKG None  Radiology No results found.  Procedures Procedures   Medications Ordered in ED Medications - No data to display  ED Course  I have reviewed the triage vital signs and the nursing notes.  Pertinent labs & imaging results that were available during my care of the patient were reviewed by me and considered in my medical decision making (see chart for details).    MDM Rules/Calculators/A&P                          45 year old female with positive COVID-19 home test.  Several in her home have tested positive.  She has had fevers chills body aches and fatigue for 2 days.  Symptoms have been improving.  She is requesting PCR test today for her employer.  She will continue with conservative treatment and she understands quarantine and signs symptoms return to the ER for.  Offered Paxlovid but patient not interested at this time Final Clinical Impression(s) / ED Diagnoses Final diagnoses:  COVID    Rx / DC Orders ED Discharge Orders     None        47 09/16/20 11/17/20, MD 09/16/20 2010

## 2020-09-16 NOTE — ED Notes (Signed)
Pt calm , collective, denied pain ambulatory upon discharge

## 2020-09-16 NOTE — Discharge Instructions (Addendum)
Please make sure you are drinking lots of fluids.  Continue with Tylenol and ibuprofen.  Return to the ER for any fevers, chest pain, shortness of breath, worsening symptoms or any changes in your health.

## 2020-09-24 DIAGNOSIS — R059 Cough, unspecified: Secondary | ICD-10-CM

## 2020-09-24 HISTORY — DX: Cough, unspecified: R05.9

## 2020-12-22 IMAGING — CT CT RENAL STONE PROTOCOL
2 of 4 series · 17 of 46 positions shown, 19 images · non-contrast
Comparison: 03/09/2017.

CLINICAL DATA: Right flank pain since yesterday.

EXAM:
CT ABDOMEN AND PELVIS WITHOUT CONTRAST
TECHNIQUE: Multidetector CT imaging of the abdomen and pelvis was performed
following the standard protocol without IV contrast.

[Series 2: stone full standard · axial · 0.81mm/px · z∈[-839,-444]mm · 14 of 89 slices shown, 16 images]
[im 5/89  soft-tissue]
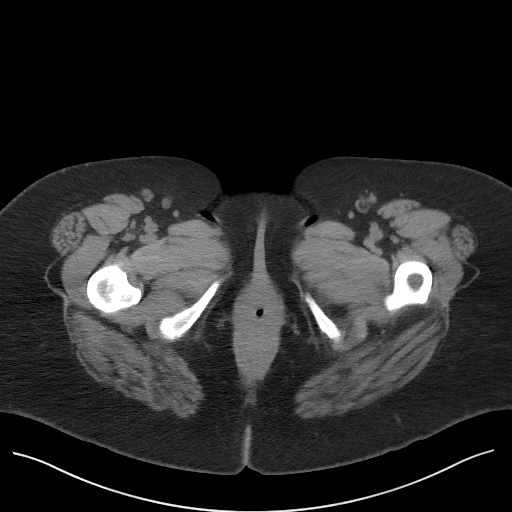
[im 5/89  bone]
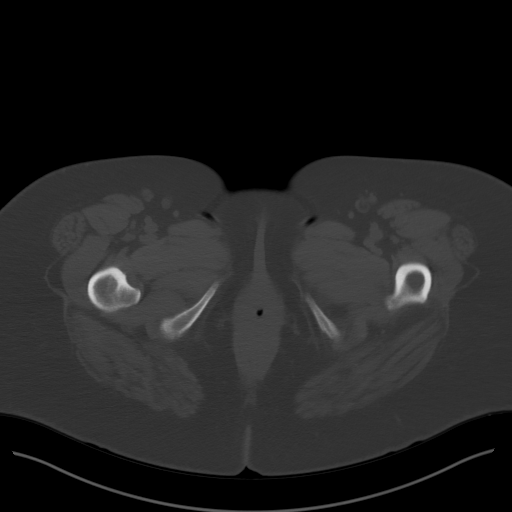
[im 13/89  soft-tissue]
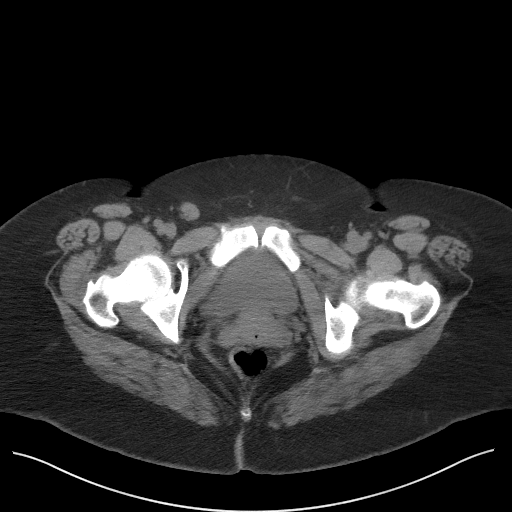
[im 17/89  soft-tissue]
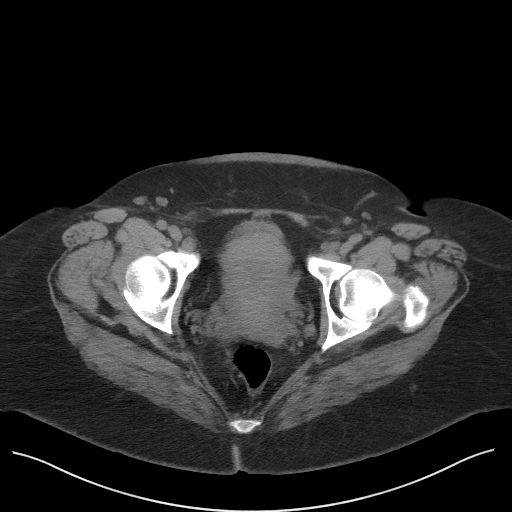
[im 26/89  soft-tissue]
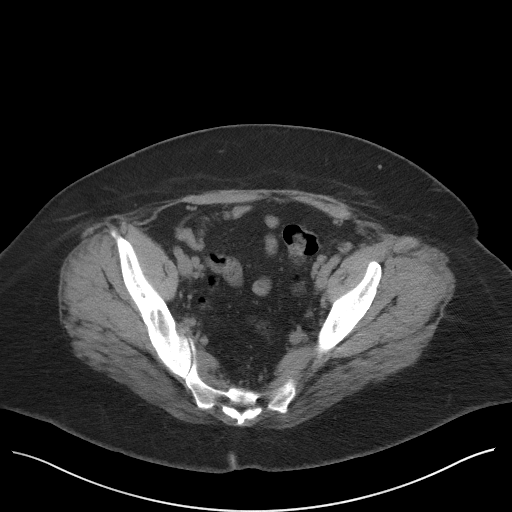
[im 30/89  soft-tissue]
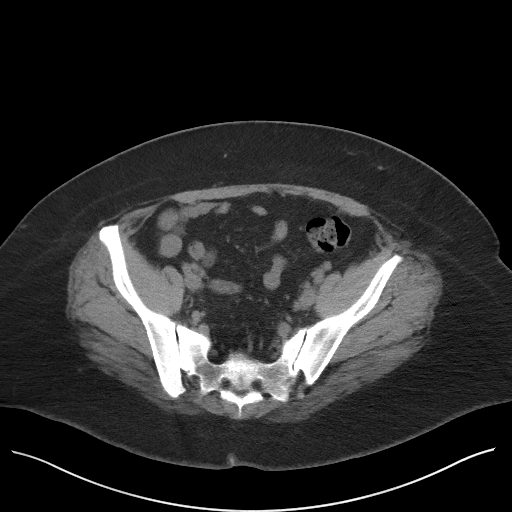
[im 34/89  soft-tissue]
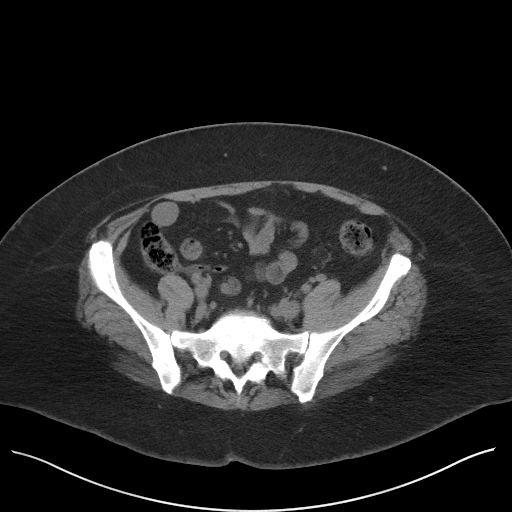
[im 42/89  soft-tissue]
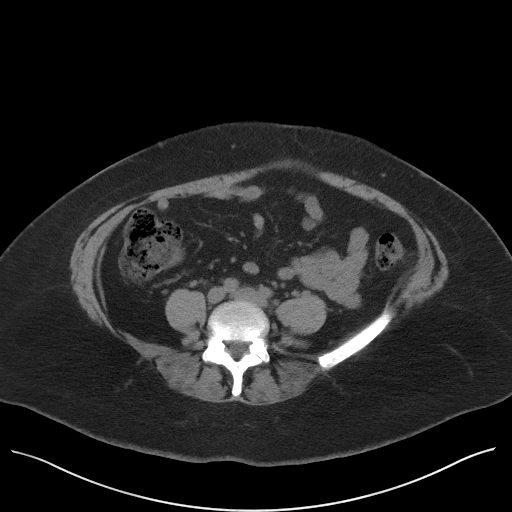
[im 47/89  soft-tissue]
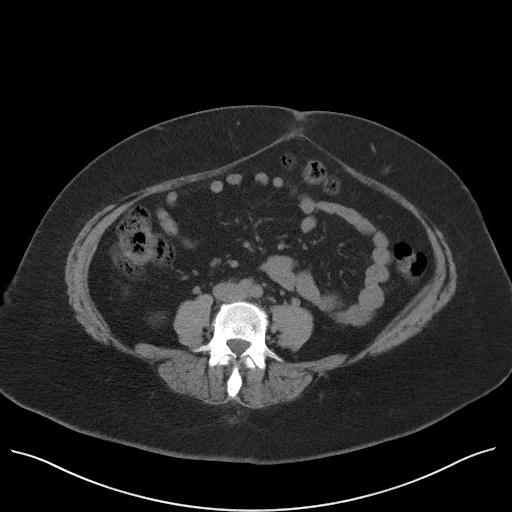
[im 55/89  soft-tissue]
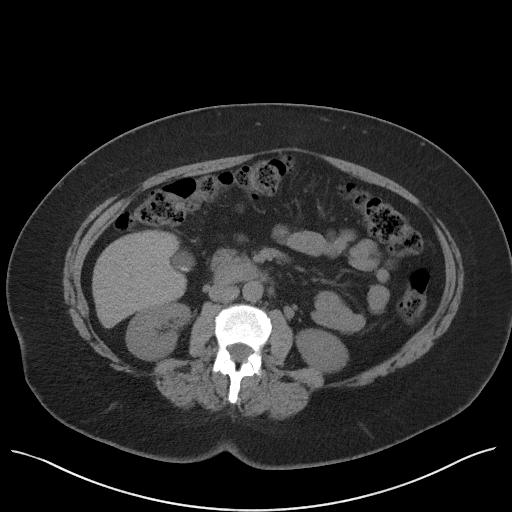
[im 55/89  bone]
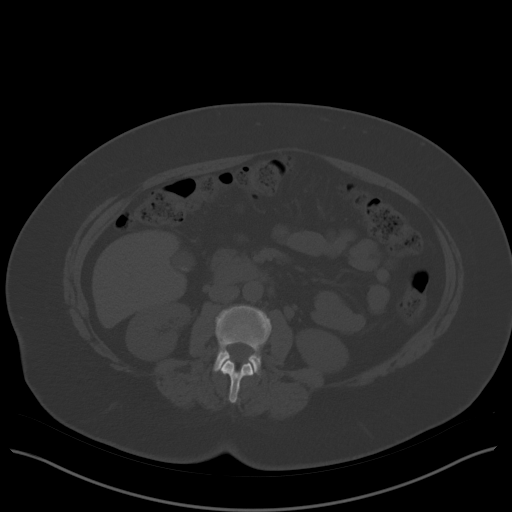
[im 59/89  soft-tissue]
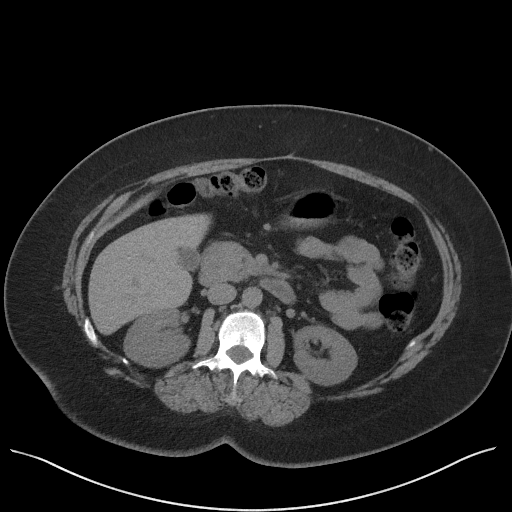
[im 68/89  soft-tissue]
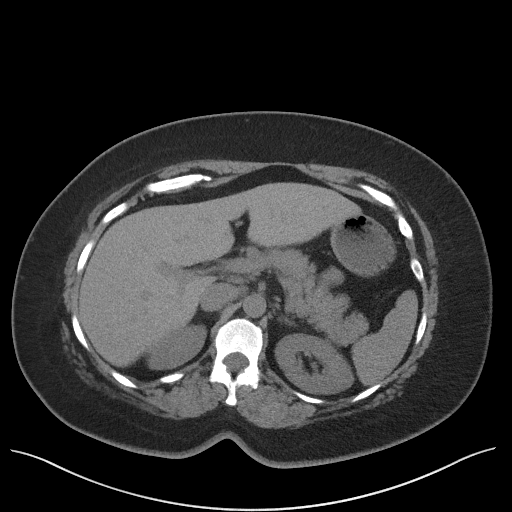
[im 72/89  soft-tissue]
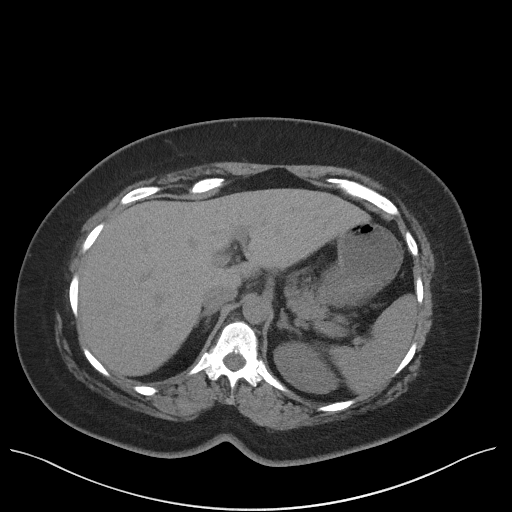
[im 76/89  soft-tissue]
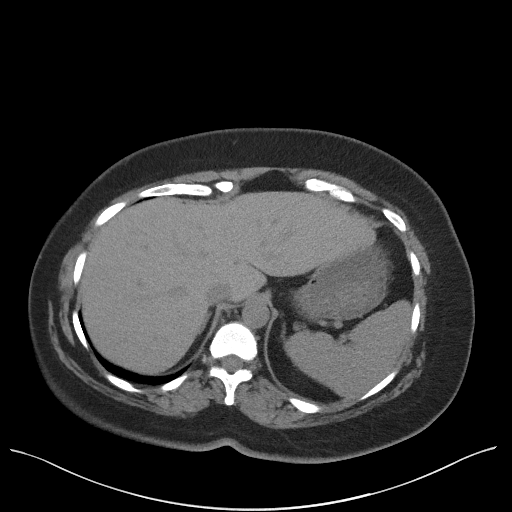
[im 84/89  soft-tissue]
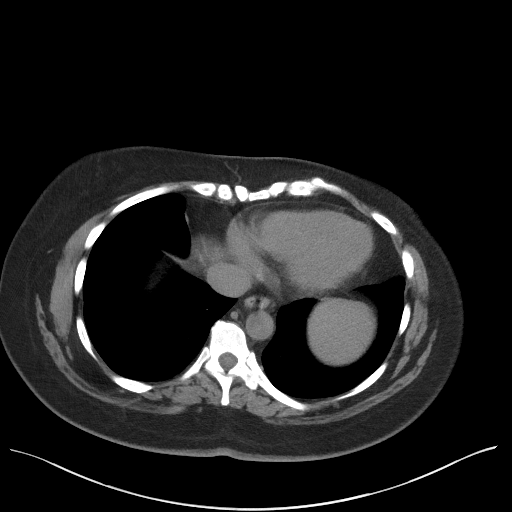

[Series 5: coronal · coronal · 0.79mm/px · 3 of 142 slices shown]
[im 48/142  soft-tissue]
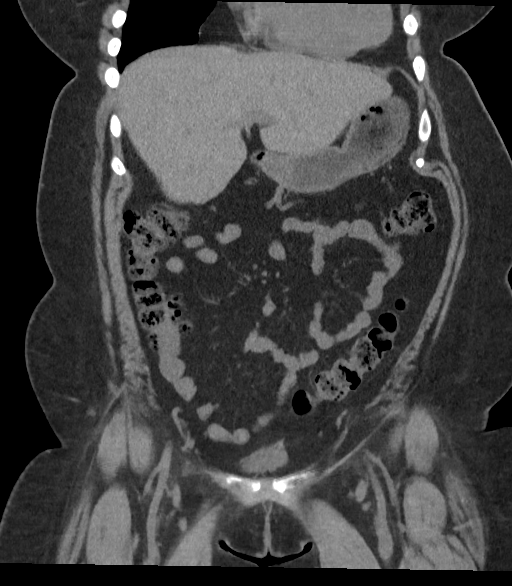
[im 63/142  soft-tissue]
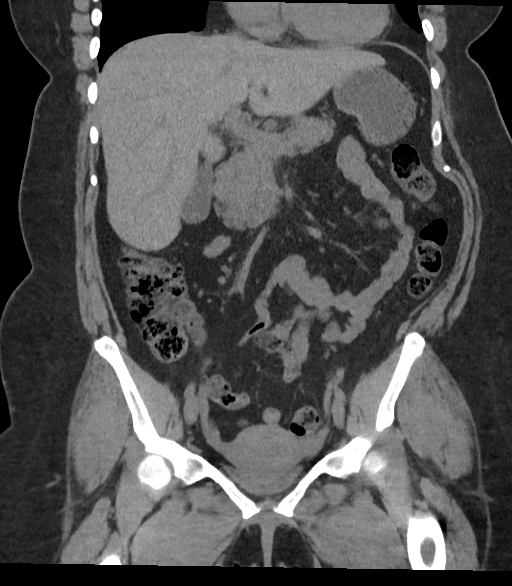
[im 79/142  soft-tissue]
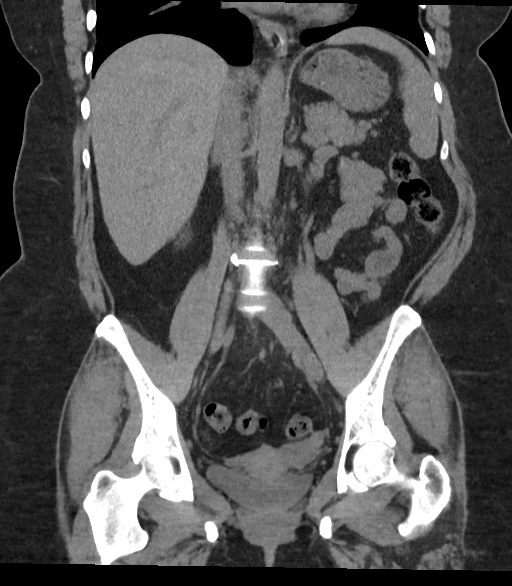

[17 of 46 positions shown; findings below may reference images not displayed]

FINDINGS: Lower chest: Unremarkable.

Hepatobiliary: Multiple small calculi in the dependent portion of
the gallbladder, measuring approximately 2 mm in maximum diameter
each. No gallbladder wall thickening or pericholecystic fluid.
Normal appearing liver.

Pancreas: Unremarkable. No pancreatic ductal dilatation or
surrounding inflammatory changes.

Spleen: Normal in size without focal abnormality.

Adrenals/Urinary Tract: Adrenal glands are unremarkable. Kidneys are
normal, without renal calculi, focal lesion, or hydronephrosis.
Bladder is unremarkable.

Stomach/Bowel: Stomach is within normal limits. Appendix appears
normal. No evidence of bowel wall thickening, distention, or
inflammatory changes.

Vascular/Lymphatic: No significant vascular findings are present. No
enlarged abdominal or pelvic lymph nodes.

Reproductive: Uterus and bilateral adnexa are unremarkable.

Other: Small umbilical hernia containing fat.

Musculoskeletal: Mild degenerative changes at the L5-S1 level.
IMPRESSION: 1. No acute abnormality.
2. Cholelithiasis.

## 2021-05-19 ENCOUNTER — Ambulatory Visit
Admission: EM | Admit: 2021-05-19 | Discharge: 2021-05-19 | Disposition: A | Discharge status: Home / Self Care | Payer: BC Managed Care – PPO | Attending: Emergency Medicine | Admitting: Emergency Medicine

## 2021-05-19 ENCOUNTER — Emergency Department: Payer: BC Managed Care – PPO | Admitting: Anesthesiology

## 2021-05-19 ENCOUNTER — Encounter: Payer: Self-pay | Admitting: Emergency Medicine

## 2021-05-19 ENCOUNTER — Emergency Department: Payer: BC Managed Care – PPO

## 2021-05-19 ENCOUNTER — Encounter
Admission: EM | Disposition: A | Discharge status: Home / Self Care | Payer: Self-pay | Source: Home / Self Care | Attending: Emergency Medicine

## 2021-05-19 ENCOUNTER — Other Ambulatory Visit: Payer: Self-pay

## 2021-05-19 DIAGNOSIS — Z20822 Contact with and (suspected) exposure to covid-19: Secondary | ICD-10-CM | POA: Diagnosis not present

## 2021-05-19 DIAGNOSIS — R102 Pelvic and perineal pain: Secondary | ICD-10-CM

## 2021-05-19 DIAGNOSIS — N8311 Corpus luteum cyst of right ovary: Secondary | ICD-10-CM

## 2021-05-19 DIAGNOSIS — N83511 Torsion of right ovary and ovarian pedicle: Secondary | ICD-10-CM | POA: Diagnosis not present

## 2021-05-19 DIAGNOSIS — J45909 Unspecified asthma, uncomplicated: Secondary | ICD-10-CM | POA: Diagnosis not present

## 2021-05-19 DIAGNOSIS — N838 Other noninflammatory disorders of ovary, fallopian tube and broad ligament: Secondary | ICD-10-CM

## 2021-05-19 HISTORY — PX: LAPAROSCOPY: SHX197

## 2021-05-19 LAB — RESP PANEL BY RT-PCR (FLU A&B, COVID) ARPGX2
Influenza A by PCR: NEGATIVE
Influenza B by PCR: NEGATIVE
SARS Coronavirus 2 by RT PCR: NEGATIVE

## 2021-05-19 LAB — URINALYSIS, ROUTINE W REFLEX MICROSCOPIC
Bacteria, UA: NONE SEEN
Bilirubin Urine: NEGATIVE
Glucose, UA: NEGATIVE mg/dL
Ketones, ur: NEGATIVE mg/dL
Leukocytes,Ua: NEGATIVE
Nitrite: NEGATIVE
Protein, ur: NEGATIVE mg/dL
Specific Gravity, Urine: 1.023 (ref 1.005–1.030)
pH: 5 (ref 5.0–8.0)

## 2021-05-19 LAB — CBC
HCT: 37.6 % (ref 36.0–46.0)
Hemoglobin: 11.9 g/dL — ABNORMAL LOW (ref 12.0–15.0)
MCH: 26.4 pg (ref 26.0–34.0)
MCHC: 31.6 g/dL (ref 30.0–36.0)
MCV: 83.6 fL (ref 80.0–100.0)
Platelets: 251 10*3/uL (ref 150–400)
RBC: 4.5 MIL/uL (ref 3.87–5.11)
RDW: 13.1 % (ref 11.5–15.5)
WBC: 7.2 10*3/uL (ref 4.0–10.5)
nRBC: 0 % (ref 0.0–0.2)

## 2021-05-19 LAB — COMPREHENSIVE METABOLIC PANEL
ALT: 29 U/L (ref 0–44)
AST: 24 U/L (ref 15–41)
Albumin: 4.1 g/dL (ref 3.5–5.0)
Alkaline Phosphatase: 117 U/L (ref 38–126)
Anion gap: 8 (ref 5–15)
BUN: 10 mg/dL (ref 6–20)
CO2: 28 mmol/L (ref 22–32)
Calcium: 9.3 mg/dL (ref 8.9–10.3)
Chloride: 101 mmol/L (ref 98–111)
Creatinine, Ser: 0.59 mg/dL (ref 0.44–1.00)
GFR, Estimated: >60 mL/min (ref >60)
Glucose, Bld: 108 mg/dL — ABNORMAL HIGH (ref 70–99)
Potassium: 4 mmol/L (ref 3.5–5.1)
Sodium: 137 mmol/L (ref 135–145)
Total Bilirubin: 0.6 mg/dL (ref 0.3–1.2)
Total Protein: 8.1 g/dL (ref 6.5–8.1)

## 2021-05-19 LAB — LIPASE, BLOOD: Lipase: 34 U/L (ref 11–51)

## 2021-05-19 LAB — POC URINE PREG, ED: Preg Test, Ur: NEGATIVE

## 2021-05-19 SURGERY — LAPAROSCOPY, DIAGNOSTIC
Anesthesia: General | Laterality: Right

## 2021-05-19 MED ORDER — ROCURONIUM BROMIDE 10 MG/ML (PF) SYRINGE
PREFILLED_SYRINGE | INTRAVENOUS | Status: AC
  Filled 2021-05-19: qty 10

## 2021-05-19 MED ORDER — ONDANSETRON HCL 4 MG/2ML IJ SOLN
INTRAMUSCULAR | Status: AC
  Filled 2021-05-19: qty 2

## 2021-05-19 MED ORDER — ONDANSETRON HCL 4 MG/2ML IJ SOLN
INTRAMUSCULAR | Status: AC
Start: 2021-05-19 — End: ?
  Filled 2021-05-19: qty 2

## 2021-05-19 MED ORDER — OXYCODONE-ACETAMINOPHEN 5-325 MG PO TABS
1.0000 | ORAL_TABLET | Freq: Once | ORAL | Status: AC
  Administered 2021-05-19: 1 via ORAL
  Filled 2021-05-19: qty 1

## 2021-05-19 MED ORDER — FENTANYL CITRATE (PF) 100 MCG/2ML IJ SOLN
INTRAMUSCULAR | Status: DC | PRN
  Administered 2021-05-19: 25 ug via INTRAVENOUS
  Administered 2021-05-19 (×2): 50 ug via INTRAVENOUS

## 2021-05-19 MED ORDER — KETOROLAC TROMETHAMINE 30 MG/ML IJ SOLN
30.0000 mg | Freq: Once | INTRAMUSCULAR | Status: AC
  Administered 2021-05-19: 30 mg via INTRAMUSCULAR
  Filled 2021-05-19: qty 1

## 2021-05-19 MED ORDER — ONDANSETRON HCL 4 MG/2ML IJ SOLN: 4.0000 mg | Freq: Once | INTRAMUSCULAR | Status: DC | PRN

## 2021-05-19 MED ORDER — MIDAZOLAM HCL 2 MG/2ML IJ SOLN
INTRAMUSCULAR | Status: AC
  Filled 2021-05-19: qty 2

## 2021-05-19 MED ORDER — ONDANSETRON HCL 4 MG/2ML IJ SOLN
INTRAMUSCULAR | Status: DC | PRN
  Administered 2021-05-19: 4 mg via INTRAVENOUS

## 2021-05-19 MED ORDER — FENTANYL CITRATE (PF) 100 MCG/2ML IJ SOLN
INTRAMUSCULAR | Status: AC
  Filled 2021-05-19: qty 2

## 2021-05-19 MED ORDER — PROPOFOL 10 MG/ML IV BOLUS
INTRAVENOUS | Status: AC
  Filled 2021-05-19: qty 20

## 2021-05-19 MED ORDER — MORPHINE SULFATE (PF) 4 MG/ML IV SOLN
4.0000 mg | Freq: Once | INTRAVENOUS | Status: DC
  Filled 2021-05-19: qty 1

## 2021-05-19 MED ORDER — LACTATED RINGERS IV SOLN: INTRAVENOUS | Status: DC | PRN

## 2021-05-19 MED ORDER — FENTANYL CITRATE (PF) 100 MCG/2ML IJ SOLN
INTRAMUSCULAR | Status: AC
  Administered 2021-05-19: 25 ug via INTRAVENOUS
  Filled 2021-05-19: qty 2

## 2021-05-19 MED ORDER — ROCURONIUM BROMIDE 100 MG/10ML IV SOLN
INTRAVENOUS | Status: DC | PRN
  Administered 2021-05-19: 30 mg via INTRAVENOUS
  Administered 2021-05-19: 20 mg via INTRAVENOUS

## 2021-05-19 MED ORDER — MIDAZOLAM HCL 2 MG/2ML IJ SOLN
INTRAMUSCULAR | Status: DC | PRN
Start: 2021-05-19 — End: 2021-05-19
  Administered 2021-05-19: 2 mg via INTRAVENOUS

## 2021-05-19 MED ORDER — DEXTROSE IN LACTATED RINGERS 5 % IV SOLN: INTRAVENOUS | Status: DC

## 2021-05-19 MED ORDER — PROPOFOL 10 MG/ML IV BOLUS
INTRAVENOUS | Status: DC | PRN
  Administered 2021-05-19: 130 mg via INTRAVENOUS
  Administered 2021-05-19: 50 mg via INTRAVENOUS
  Administered 2021-05-19: 100 mg via INTRAVENOUS

## 2021-05-19 MED ORDER — FENTANYL CITRATE (PF) 100 MCG/2ML IJ SOLN
25.0000 ug | INTRAMUSCULAR | Status: DC | PRN
  Administered 2021-05-19 (×3): 25 ug via INTRAVENOUS

## 2021-05-19 MED ORDER — ONDANSETRON 4 MG PO TBDP
4.0000 mg | ORAL_TABLET | Freq: Once | ORAL | Status: AC
  Administered 2021-05-19: 4 mg via ORAL
  Filled 2021-05-19: qty 1

## 2021-05-19 MED ORDER — DEXAMETHASONE SODIUM PHOSPHATE 10 MG/ML IJ SOLN
INTRAMUSCULAR | Status: AC
  Filled 2021-05-19: qty 1

## 2021-05-19 MED ORDER — LIDOCAINE HCL (CARDIAC) PF 100 MG/5ML IV SOSY
PREFILLED_SYRINGE | INTRAVENOUS | Status: DC | PRN
  Administered 2021-05-19: 40 mg via INTRAVENOUS

## 2021-05-19 MED ORDER — SUGAMMADEX SODIUM 200 MG/2ML IV SOLN
INTRAVENOUS | Status: DC | PRN
Start: 2021-05-19 — End: 2021-05-19
  Administered 2021-05-19 (×2): 200 mg via INTRAVENOUS

## 2021-05-19 MED ORDER — OXYCODONE-ACETAMINOPHEN 5-325 MG PO TABS: 1.0000 | ORAL_TABLET | ORAL | 0 refills | Status: DC | PRN

## 2021-05-19 MED ORDER — BUPIVACAINE HCL 0.5 % IJ SOLN
INTRAMUSCULAR | Status: DC | PRN
  Administered 2021-05-19: 15 mL

## 2021-05-19 MED ORDER — KETOROLAC TROMETHAMINE 30 MG/ML IJ SOLN
INTRAMUSCULAR | Status: AC
  Administered 2021-05-19: 30 mg via INTRAVENOUS
  Filled 2021-05-19: qty 1

## 2021-05-19 MED ORDER — KETOROLAC TROMETHAMINE 30 MG/ML IJ SOLN: 30.0000 mg | Freq: Once | INTRAMUSCULAR | Status: AC

## 2021-05-19 MED ORDER — SUCCINYLCHOLINE CHLORIDE 200 MG/10ML IV SOSY
PREFILLED_SYRINGE | INTRAVENOUS | Status: AC
  Filled 2021-05-19: qty 10

## 2021-05-19 MED ORDER — OXYCODONE HCL 5 MG PO TABS
ORAL_TABLET | ORAL | Status: AC
  Administered 2021-05-19: 5 mg via ORAL
  Filled 2021-05-19: qty 1

## 2021-05-19 MED ORDER — SUCCINYLCHOLINE CHLORIDE 200 MG/10ML IV SOSY
PREFILLED_SYRINGE | INTRAVENOUS | Status: DC | PRN
Start: 2021-05-19 — End: 2021-05-19
  Administered 2021-05-19: 100 mg via INTRAVENOUS
  Administered 2021-05-19: 140 mg via INTRAVENOUS

## 2021-05-19 MED ORDER — DEXAMETHASONE SODIUM PHOSPHATE 10 MG/ML IJ SOLN
INTRAMUSCULAR | Status: DC | PRN
  Administered 2021-05-19: 10 mg via INTRAVENOUS

## 2021-05-19 MED ORDER — GLYCOPYRROLATE PF 0.2 MG/ML IJ SOSY
PREFILLED_SYRINGE | INTRAMUSCULAR | Status: DC | PRN
  Administered 2021-05-19: .2 mg via INTRAVENOUS

## 2021-05-19 MED ORDER — ONDANSETRON HCL 4 MG/2ML IJ SOLN: 4.0000 mg | Freq: Four times a day (QID) | INTRAMUSCULAR | Status: DC | PRN

## 2021-05-19 MED ORDER — FENTANYL CITRATE (PF) 100 MCG/2ML IJ SOLN
INTRAMUSCULAR | Status: AC
Start: 2021-05-19 — End: ?
  Filled 2021-05-19: qty 2

## 2021-05-19 MED ORDER — ACETAMINOPHEN 500 MG PO TABS
ORAL_TABLET | ORAL | Status: AC
  Administered 2021-05-19: 1000 mg via ORAL
  Filled 2021-05-19: qty 2

## 2021-05-19 MED ORDER — ONDANSETRON 4 MG PO TBDP: 4.0000 mg | ORAL_TABLET | Freq: Four times a day (QID) | ORAL | Status: DC | PRN

## 2021-05-19 MED ORDER — ACETAMINOPHEN 500 MG PO TABS: 1000.0000 mg | ORAL_TABLET | Freq: Once | ORAL | Status: AC

## 2021-05-19 MED ORDER — OXYCODONE-ACETAMINOPHEN 5-325 MG PO TABS: 1.0000 | ORAL_TABLET | ORAL | Status: DC | PRN

## 2021-05-19 MED ORDER — OXYCODONE HCL 5 MG PO TABS: 5.0000 mg | ORAL_TABLET | Freq: Once | ORAL | Status: AC

## 2021-05-19 SURGICAL SUPPLY — 50 items
APL PRP STRL LF DISP 70% ISPRP (MISCELLANEOUS) ×1
BACTOSHIELD CHG 4% 4OZ (MISCELLANEOUS) ×2
BAG DECANTER FOR FLEXI CONT (MISCELLANEOUS) ×4 IMPLANT
BAG RETRIEVAL 10 (BASKET) ×1
BAG RETRIEVAL 10MM (BASKET) ×1
BLADE SURG 15 STRL LF DISP TIS (BLADE) ×2 IMPLANT
BLADE SURG 15 STRL SS (BLADE) ×3
BLADE SURG SZ11 CARB STEEL (BLADE) ×4 IMPLANT
CATH ROBINSON RED A/P 16FR (CATHETERS) ×4 IMPLANT
CHLORAPREP W/TINT 26 (MISCELLANEOUS) ×4 IMPLANT
CLOSURE WOUND 1/2 X4 (GAUZE/BANDAGES/DRESSINGS) ×1
ELECT REM PT RETURN 9FT ADLT (ELECTROSURGICAL) ×3
ELECTRODE REM PT RTRN 9FT ADLT (ELECTROSURGICAL) ×2 IMPLANT
GAUZE 4X4 16PLY ~~LOC~~+RFID DBL (SPONGE) ×4 IMPLANT
GLOVE SURG ENC MOIS LTX SZ8 (GLOVE) ×4 IMPLANT
GLOVE SURG POLY ORTHO LF SZ7.5 (GLOVE) ×4 IMPLANT
GOWN STRL REUS W/ TWL LRG LVL3 (GOWN DISPOSABLE) ×4 IMPLANT
GOWN STRL REUS W/TWL LRG LVL3 (GOWN DISPOSABLE) ×6
GOWN STRL REUS W/TWL XL LVL4 (GOWN DISPOSABLE) ×4 IMPLANT
IRRIGATION STRYKERFLOW (MISCELLANEOUS) IMPLANT
IRRIGATOR STRYKERFLOW (MISCELLANEOUS)
IV LACTATED RINGERS 1000ML (IV SOLUTION) ×4 IMPLANT
KIT PINK PAD W/HEAD ARE REST (MISCELLANEOUS) ×3 IMPLANT
KIT PINK PAD W/HEAD ARM REST (MISCELLANEOUS) ×2 IMPLANT
KIT TURNOVER CYSTO (KITS) ×4 IMPLANT
LIGASURE LAP MARYLAND 5MM 37CM (ELECTROSURGICAL) ×2 IMPLANT
MANIFOLD NEPTUNE II (INSTRUMENTS) ×4 IMPLANT
NEEDLE HYPO 22GX1.5 SAFETY (NEEDLE) ×4 IMPLANT
NS IRRIG 500ML POUR BTL (IV SOLUTION) ×4 IMPLANT
PACK GYN LAPAROSCOPIC (MISCELLANEOUS) ×4 IMPLANT
PAD OB MATERNITY 4.3X12.25 (PERSONAL CARE ITEMS) ×4 IMPLANT
PAD PREP 24X41 OB/GYN DISP (PERSONAL CARE ITEMS) ×4 IMPLANT
SCRUB CHG 4% DYNA-HEX 4OZ (MISCELLANEOUS) ×2 IMPLANT
SET TUBE SMOKE EVAC HIGH FLOW (TUBING) ×4 IMPLANT
SLEEVE ENDOPATH XCEL 5M (ENDOMECHANICALS) IMPLANT
STRIP CLOSURE SKIN 1/2X4 (GAUZE/BANDAGES/DRESSINGS) ×3 IMPLANT
SUT VICRYL 0 AB UR-6 (SUTURE) ×4 IMPLANT
SUT VICRYL 4-0  27 PS-2 BARIAT (SUTURE) ×3
SUT VICRYL 4-0 27 PS-2 BARIAT (SUTURE) ×1
SUTURE VICRYL 4-0 27 PS-2 BART (SUTURE) ×2 IMPLANT
SYS BAG RETRIEVAL 10MM (BASKET) ×1
SYSTEM BAG RETRIEVAL 10MM (BASKET) IMPLANT
TOWEL OR 17X26 4PK STRL BLUE (TOWEL DISPOSABLE) ×4 IMPLANT
TROCAR ENDO BLADELESS 11MM (ENDOMECHANICALS) IMPLANT
TROCAR XCEL NON-BLD 11X100MML (ENDOMECHANICALS) ×2 IMPLANT
TROCAR XCEL NON-BLD 5MMX100MML (ENDOMECHANICALS) ×4 IMPLANT
TROCAR XCEL UNIV SLVE 11M 100M (ENDOMECHANICALS) IMPLANT
TUBING CONNECTING 10 (TUBING) ×3 IMPLANT
TUBING CONNECTING 10' (TUBING) ×1
WATER STERILE IRR 500ML POUR (IV SOLUTION) ×4 IMPLANT

## 2021-05-19 NOTE — ED Provider Notes (Signed)
Patient signed out to me pending labs.  She is a 46 year old female presenting with cute onset of right-sided abdominal pain.  History of ovarian cyst.  Patient's labs overall benign.  On exam she does appear uncomfortable.  Pelvic ultrasound ordered.  I spoke with radiology who notes that the ultrasound is concerning for right-sided torsion with an enlarged edematous ovary with some impaired internal flow.  Spoke with Dr. Logan Bores who will see the patient and likely take her to the OR.  Patient is NPO. ?  ?Georga Hacking, MD ?05/19/21 1433 ? ?

## 2021-05-19 NOTE — ED Triage Notes (Signed)
Pt reports was fine this am and then suddenly she started pain across her mid abdomen. Pt reports has had this pain before several times and it was due to an ovarian cyst. Pt also states is feeling nauseated.  ?

## 2021-05-19 NOTE — Anesthesia Preprocedure Evaluation (Signed)
Anesthesia Evaluation  ?Patient identified by MRN, date of birth, ID band ?Patient awake ? ? ? ?Reviewed: ?Allergy & Precautions, H&P , NPO status , Patient's Chart, lab work & pertinent test results, reviewed documented beta blocker date and time  ? ?Airway ?Mallampati: III ? ?TM Distance: >3 FB ?Neck ROM: full ? ? ? Dental ? ?(+) Teeth Intact ?  ?Pulmonary ?neg shortness of breath, asthma ,  ?  ?Pulmonary exam normal ? ? ? ? ? ? ? Cardiovascular ?Exercise Tolerance: Poor ?negative cardio ROS ?Normal cardiovascular exam ?Rhythm:regular Rate:Normal ? ? ?  ?Neuro/Psych ? Headaches, negative psych ROS  ? GI/Hepatic ?negative GI ROS, Neg liver ROS,   ?Endo/Other  ?negative endocrine ROS ? Renal/GU ?negative Renal ROS  ?negative genitourinary ?  ?Musculoskeletal ? ? Abdominal ?  ?Peds ? Hematology ?negative hematology ROS ?(+)   ?Anesthesia Other Findings ?Past Medical History: ?No date: Asthma ?No date: Degenerative disc disease, cervical ?No date: Degenerative disc disease, lumbar ?No date: Migraines ?Past Surgical History: ?No date: CESAREAN SECTION ?No date: Tendons replaced ?No date: TONSILLECTOMY ?BMI   ? Body Mass Index: 42.18 kg/m?  ?  ? Reproductive/Obstetrics ?negative OB ROS ? ?  ? ? ? ? ? ? ? ? ? ? ? ? ? ?  ?  ? ? ? ? ? ? ? ? ?Anesthesia Physical ?Anesthesia Plan ? ?ASA: 3 and emergent ? ?Anesthesia Plan: General ETT  ? ?Post-op Pain Management:   ? ?Induction:  ? ?PONV Risk Score and Plan: 4 or greater ? ?Airway Management Planned:  ? ?Additional Equipment:  ? ?Intra-op Plan:  ? ?Post-operative Plan:  ? ?Informed Consent: I have reviewed the patients History and Physical, chart, labs and discussed the procedure including the risks, benefits and alternatives for the proposed anesthesia with the patient or authorized representative who has indicated his/her understanding and acceptance.  ? ? ? ?Dental Advisory Given ? ?Plan Discussed with: CRNA ? ?Anesthesia Plan Comments:    ? ? ? ? ? ? ?Anesthesia Quick Evaluation ? ?

## 2021-05-19 NOTE — H&P (Signed)
@LOGO @ ? ?    PRE-OPERATIVE HISTORY AND PHYSICAL EXAM ? ?PCP:  , NP (Inactive) ?Subjective:  ? ?HPI:  Tammy Hansen is a 46 y.o. [phone].  Patient's last menstrual period was 04/19/2021 (approximate). ? ?She presented to the emergency department with right-sided abdominal pain worse became worse over the last several hours.  She reports that then spread throughout her abdomen. ?At this time her pain has lessened a great deal but she continues to have mild abdominal pain. ? ?Review of Systems:  ? ?Constitutional: Denied constitutional symptoms, night sweats, recent illness, fatigue, fever, insomnia and weight loss.  ?Eyes: Denied eye symptoms, eye pain, photophobia, vision change and visual disturbance.  ?Ears/Nose/Throat/Neck: Denied ear, nose, throat or neck symptoms, hearing loss, nasal discharge, sinus congestion and sore throat.  ?Cardiovascular: Denied cardiovascular symptoms, arrhythmia, chest pain/pressure, edema, exercise intolerance, orthopnea and palpitations.  ?Respiratory: Denied pulmonary symptoms, asthma, pleuritic pain, productive sputum, cough, dyspnea and wheezing.  ?Gastrointestinal: Denied, gastro-esophageal reflux, melena, nausea and vomiting.  ?Genitourinary: See HPI for additional information.  ?Musculoskeletal: Denied musculoskeletal symptoms, stiffness, swelling, muscle weakness and myalgia.  ?Dermatologic: Denied dermatology symptoms, rash and scar.  ?Neurologic: Denied neurology symptoms, dizziness, headache, neck pain and syncope.  ?Psychiatric: Denied psychiatric symptoms, anxiety and depression.  ?Endocrine: Denied endocrine symptoms including hot flashes and night sweats.  ? ?OB History  ?Gravida Para Term Preterm AB Living  ?9 3 3     3   ?SAB IAB Ectopic Multiple Live Births  ?           ?  ?# Outcome Date GA Lbr Len/2nd Weight Sex Delivery Anes PTL Lv  ?9 Gravida           ?8 Gravida           ?7 Gravida           ?6 Gravida           ?5 Gravida           ?4 Gravida            ?3 Term           ?2 Term           ?1 Term           ? ? ?Past Medical History:  ?Diagnosis Date  ? Asthma   ? Degenerative disc disease, cervical   ? Degenerative disc disease, lumbar   ? Migraines   ? ? ?Past Surgical History:  ?Procedure Laterality Date  ? CESAREAN SECTION    ? Tendons replaced    ? TONSILLECTOMY    ?   ? ?SOCIAL HISTORY: ? ?Social History  ? ?Tobacco Use  ?Smoking Status Never  ?Smokeless Tobacco Never  ? ?Social History  ? ?Substance and Sexual Activity  ?Alcohol Use Yes  ? Alcohol/week: 1.0 standard drink  ? Types: 1 Standard drinks or equivalent per week  ? ? ?Social History  ? ?Substance and Sexual Activity  ?Drug Use No  ? ? ?Family History  ?Problem Relation Age of Onset  ? Hyperlipidemia Mother   ? Diabetes Mother   ? Heart failure Mother   ? ? ?ALLERGIES:  Morphine and related and Mucinex [guaifenesin er] ? ?MEDS: ?  ?No current facility-administered medications on file prior to encounter.  ? ?Current Outpatient Medications on File Prior to Encounter  ?Medication Sig Dispense Refill  ? levocetirizine (XYZAL) 5 MG tablet Take 5 mg by mouth daily.    ?  montelukast (SINGULAIR) 10 MG tablet Take 10 mg by mouth daily.    ? amoxicillin-clavulanate (AUGMENTIN) 875-125 MG tablet Take 1 tablet by mouth every 12 (twelve) hours. (Patient not taking: Reported on 05/19/2021) 14 tablet 0  ? cetirizine (ZYRTEC ALLERGY) 10 MG tablet Take 1 tablet (10 mg total) by mouth daily. (Patient not taking: Reported on 05/19/2021) 30 tablet 0  ? fluticasone (FLONASE) 50 MCG/ACT nasal spray Place 1 spray into both nostrils daily. (Patient not taking: Reported on 05/19/2021) 16 g 0  ? ondansetron (ZOFRAN ODT) 4 MG disintegrating tablet Take 1 tablet (4 mg total) by mouth every 8 (eight) hours as needed for nausea or vomiting. (Patient not taking: Reported on 05/19/2021) 15 tablet 0  ? ? ?Meds ordered this encounter  ?Medications  ? ketorolac (TORADOL) 30 MG/ML injection 30 mg  ? oxyCODONE-acetaminophen  (PERCOCET/ROXICET) 5-325 MG per tablet 1 tablet  ? ondansetron (ZOFRAN-ODT) disintegrating tablet 4 mg  ? DISCONTD: morphine (PF) 4 MG/ML injection 4 mg  ?  ? ?Physical examination ?BP (!) 103/58   Pulse 90   Temp (!) 97.5 ?F (36.4 ?C) (Oral)   Resp 16   Ht 5\' 3"  (1.6 m)   Wt 108 kg   LMP 04/19/2021 (Approximate)   SpO2 98%   BMI 42.18 kg/m?  ? ?General NAD, Conversant  ?HEENT Atraumatic; Op clear with mmm.  Normo-cephalic. Pupils reactive. Anicteric sclerae  ?Thyroid/Neck Smooth without nodularity or enlargement. Normal ROM.  Neck Supple.  ?Skin No rashes, lesions or ulceration. Normal palpated skin turgor. No nodularity.  ?Breasts: No masses or discharge.  Symmetric.  No axillary adenopathy.  ?Lungs: Clear to auscultation.No rales or wheezes. Normal Respiratory effort, no retractions.  ?Heart: NSR.  No murmurs or rubs appreciated. No periferal edema  ?Abdomen: Soft.  Non-tender.  No masses.  No HSM. No hernia  ?Extremities: Moves all appropriately.  Normal ROM for age. No lymphadenopathy.  ?Neuro: Oriented to PPT.  Normal mood. Normal affect.  ? ?  Pelvic: Deferred to OR  ? ?Ultrasound: ?Asymmetric enlargement and edema of the RIGHT ovary with ?diminished/impaired intraovarian flow. ?  ?Findings are most consistent with incomplete RIGHT ovarian torsion ?versus RIGHT ovarian torsion/de-torsion. ? ? ?Assessment:  ? ?[id number] ?There are no problems to display for this patient. ? ? ?1. Pelvic pain   ?2.  Possible right ovarian torsion ? I spoke directly with radiology and reviewed the ultrasound myself as well.  The ovary is somewhat enlarged and edematous appearing.  Although there is not a cyst present torsion cannot be ruled out. ? ?Plan:  ? ?Orders: ?Meds ordered this encounter  ?Medications  ? ketorolac (TORADOL) 30 MG/ML injection 30 mg  ? oxyCODONE-acetaminophen (PERCOCET/ROXICET) 5-325 MG per tablet 1 tablet  ? ondansetron (ZOFRAN-ODT) disintegrating tablet 4 mg  ? DISCONTD: morphine (PF) 4 MG/ML  injection 4 mg  ?  ? ?1.  I spoke with the patient at length regarding the possibility of surgery today or expectant management as she currently feels much better.  She is completely opposed to expectant management and would like to go to the operating room today.  She does not want the pain to happen again and is concerned that if it is torsion detorsion he could. ?We discussed options of keeping the ovary untwisting it and shortening the utero-ovarian ligament versus right oophorectomy.  Patient has chosen right oophorectomy. ?Risks of surgery were discussed in detail including bleeding infection anesthesia and risk of damage to other internal organs.  All questions  were answered. ? ?Plan exploratory laparoscopy with possible right oophorectomy. ? ?I spent 52 minutes involved in the care of this patient preparing to see the patient by obtaining and reviewing her medical history (including labs, imaging tests and prior procedures), documenting clinical information in the electronic health record (EHR), counseling and coordinating care plans, writing and sending prescriptions, ordering tests or procedures and in direct communicating with the patient and medical staff discussing pertinent items from her history and physical exam. ? ?Elonda Husky, M.D. ?05/19/2021 ?4:01 PM ? ? ?

## 2021-05-19 NOTE — Transfer of Care (Signed)
Immediate Anesthesia Transfer of Care Note ? ?Patient: Tammy Hansen ? ?Procedure(s) Performed: LAPAROSCOPY DIAGNOSTIC POSSIBLE OOPHERECTOMY (Right) ? ?Patient Location: PACU ? ?Anesthesia Type:General ? ?Level of Consciousness: drowsy ? ?Airway & Oxygen Therapy: Patient Spontanous Breathing and Patient connected to nasal cannula oxygen ? ?Post-op Assessment: Report given to RN and Post -op Vital signs reviewed and stable ? ?Post vital signs: stable ? ?Last Vitals:  ?Vitals Value Taken Time  ?BP 131/74 05/19/21 2000  ?Temp 36.4 ?C 05/19/21 2000  ?Pulse 73 05/19/21 2001  ?Resp 16 05/19/21 2001  ?SpO2 93 % 05/19/21 2001  ?Vitals shown include unvalidated device data. ? ?Last Pain:  ?Vitals:  ? 05/19/21 2000  ?TempSrc: Skin  ?PainSc:   ?   ? ?  ? ?Complications: No notable events documented. ?

## 2021-05-19 NOTE — Anesthesia Procedure Notes (Signed)
Procedure Name: Intubation ?Date/Time: 05/19/2021 6:23 PM ?Performed by: Loree Fee, CRNA ?Pre-anesthesia Checklist: Patient identified, Patient being monitored, Timeout performed, Emergency Drugs available and Suction available ?Patient Re-evaluated:Patient Re-evaluated prior to induction ?Oxygen Delivery Method: Circle system utilized ?Preoxygenation: Pre-oxygenation with 100% oxygen ?Induction Type: IV induction ?Ventilation: Mask ventilation without difficulty ?Laryngoscope Size: Mac and 3 ?Grade View: Grade I ?Tube type: Oral ?Tube size: 7.0 mm ?Number of attempts: 1 ?Airway Equipment and Method: Stylet ?Placement Confirmation: ETT inserted through vocal cords under direct vision, positive ETCO2 and breath sounds checked- equal and bilateral ?Secured at: 2 cm ?Tube secured with: Tape ?Dental Injury: Teeth and Oropharynx as per pre-operative assessment  ? ? ? ? ?

## 2021-05-19 NOTE — Op Note (Signed)
@  LOGO@ ? ?  OPERATIVE NOTE ?05/19/2021 ?7:41 PM ? ?PRE-OPERATIVE DIAGNOSIS:  ?1) OVARIAN TORSION ? ?POST-OPERATIVE DIAGNOSIS:  ?1) R ovarian abnormality - Friable gray ? ?OPERATION:  LAPAROSCOPY DIAGNOSTIC POSSIBLE OOPHERECTOMY: 49320 (CPT?) ? ?SURGEON(S): Surgeon(s) and Role: ?   Linzie Collin, MD - Primary  ? ?ANESTHESIA: Choice ? ?ESTIMATED BLOOD LOSS: 20 mL ? ?OPERATIVE FINDINGS: Anterior abdominal adhesions of omentum and uterus. -Abnormal appearing right ovary.  Gray in color.  Very friable.  No specific evidence of torsion noted. ? ?SPECIMEN:  ?ID Type Source Tests Collected by Time Destination  ?1 : Right Ovary  Tissue PATH Gyn benign resection SURGICAL PATHOLOGY Linzie Collin, MD 05/19/2021 1916   ? ? ?COMPLICATIONS: None ? ?DISPOSITION: Stable to recovery room ? ?DESCRIPTION OF PROCEDURE: ?     The patient was prepped and draped in the dorsolithotomy position and placed under general anesthesia. The bladder was emptied. The cervix was grasped with a multi-toothed tenaculum and a uterine manipulator was placed within the cervical os respecting the position and curvature of the uterus. ?After changing gloves we proceeded abdominally. A small infraumbilical incision was made and a 5 mm trocar port was placed within the abdominopelvic cavity. The opening pressure was less than 7 mmHg.  Approximately 3 and 1/2 L of carbon dioxide gas was instilled within the abdominal pelvic cavity. The laparoscope was placed and the pelvis and abdomen were carefully inspected.  2 additional laparoscopic ports were placed a 5 mm port in the right lower quadrant and a 10/11 mm port in the left lower quadrant. ?Enlarged anterior abdominal wall adhesions from the omentum was noted this was systematically lysed using the LigaSure.  A thick adhesion from the anterior aspect of the uterus to the anterior abdominal wall was also noted that was not interfering with the remainder of the surgery.  The right ovary was identified  and found to be friable gray and definitely not normal in appearance.  The right ureter was noted to be deep within the pelvis away from the ovary and infundibulopelvic ligament..  The ovary was elevated and retracted medially and the infundibulopelvic ligament was systematic coagulated and divided.  The utero-ovarian ligament was coagulated and divided allowing the ovary to be free.  The ovary was placed in Endo Catch bag and removed from the abdomen through the left lower quadrant port incision.  Irrigation of the pelvis and removal of all irrigant was then performed.  Hemostasis was noted.  The gas was allowed to escape from the pelvis.  The 10 mm port was removed the fascia was grasped with hemostats and closed with 2 interrupted sutures of 0 Vicryl.  The skin was closed with 4-0 for all incisions followed by Dermabond. ?A long-acting anesthetic was injected.  Steri-Strips were applied. The uterine manipulator was removed. Hemostasis of the cervix was noted. The patient went to the recovery room in stable condition. ? ?Elonda Husky, M.D. ?05/19/2021 ?7:41 PM ? ?

## 2021-05-19 NOTE — Discharge Instructions (Signed)
AMBULATORY SURGERY  ?DISCHARGE INSTRUCTIONS ? ? ?The drugs that you were given will stay in your system until tomorrow so for the next 24 hours you should not: ? ?Drive an automobile ?Make any legal decisions ?Drink any alcoholic beverage ? ? ?You may resume regular meals tomorrow.  Today it is better to start with liquids and gradually work up to solid foods. ? ?You may eat anything you prefer, but it is better to start with liquids, then soup and crackers, and gradually work up to solid foods. ? ? ?Please notify your doctor immediately if you have any unusual bleeding, trouble breathing, redness and pain at the surgery site, drainage, fever, or pain not relieved by medication. ? ? ? ?Additional Instructions: ? ? ? ?Please contact your physician with any problems or Same Day Surgery at [phone], Monday through Friday 6 am to 4 pm, or [place] [facility] number at [phone].  ?

## 2021-05-19 NOTE — ED Provider Notes (Signed)
? ?[facility] ?Provider Note ? ? ? Event Date/Time  ? First MD Initiated Contact with Patient 05/19/21 1107   ?  (approximate) ? ? ?History  ? ?Abdominal Pain and Nausea ? ? ?HPI ? ?Tammy Hansen is a 46 y.o. female presents to the ER today with complaint of acute onset abdominal pain.  She reports this started about 1 hour prior to arrival.  She describes the pain as a constant, sharp pain all across her lower abdomen.  The pain does not radiate into her back.  She reports some associated nausea secondary to the pain but denies reflux, constipation, diarrhea or blood in her stool.  She denies urinary or vaginal symptoms.  She reports her periods are irregular and she does not think she is pregnant but she is not taking anything to prevent pregnancy.  Her LMP was last month and was very heavy.  She reports history of ovarian cyst in the past and reports this feels the same. ? ?  ? ? ?Physical Exam  ? ?Triage Vital Signs: ?ED Triage Vitals  ?Enc Vitals Group  ?   BP 05/19/21 1111 128/63  ?   Pulse Rate 05/19/21 1111 89  ?   Resp 05/19/21 1111 16  ?   Temp 05/19/21 1111 (!) 97.5 ?F (36.4 ?C)  ?   Temp Source 05/19/21 1111 Oral  ?   SpO2 05/19/21 1111 98 %  ?   Weight 05/19/21 1103 238 lb 1.6 oz (108 kg)  ?   Height 05/19/21 1103 '5\' 3"'  (1.6 m)  ?   Head Circumference --   ?   Peak Flow --   ?   Pain Score 05/19/21 1103 10  ?   Pain Loc --   ?   Pain Edu? --   ?   Excl. in [place]? --   ? ? ?Most recent vital signs: ?Vitals:  ? 05/19/21 1111  ?BP: 128/63  ?Pulse: 89  ?Resp: 16  ?Temp: (!) 97.5 ?F (36.4 ?C)  ?SpO2: 98%  ? ? ?General: Awake, appears uncomfortable but in no distress.  ?CV:  RRR, no murmur noted. ?Resp:  Normal effort.  CTA bilaterally. ?Abd:  Active bowel sounds.  No distention noted.  Pain with palpation in the bilateral lower quadrants. ? ? ? ?ED Results / Procedures / Treatments  ? ?Labs ?(all labs ordered are listed, but only abnormal results are displayed) ?Labs Reviewed  ?CBC -  Abnormal; Notable for the following components:  ?    Result Value  ? Hemoglobin 11.9 (*)   ? All other components within normal limits  ?COMPREHENSIVE METABOLIC PANEL  ?LIPASE, BLOOD  ?URINALYSIS, ROUTINE W REFLEX MICROSCOPIC  ?POC URINE PREG, ED  ? ? ? ?RADIOLOGY ? ?Imaging Orders    ?     US Pelvis Complete    ?     US Transvaginal Non-OB    ?     [place] Art/Ven Flow Abd Pelv Doppler    ? ? ?MEDICATIONS ORDERED IN ED: ?Medications  ?ketorolac (TORADOL) 30 MG/ML injection 30 mg (30 mg Intramuscular Given 05/19/21 1139)  ? ? ? ?IMPRESSION / MDM / ASSESSMENT AND PLAN / ED COURSE  ?I reviewed the triage vital signs and the nursing notes. ? ?Pelvic Pain and Nausea: ? ?Differential diagnosis includes, but is not limited to UTI, interstitial cystitis, vaginitis, ovarian cyst, uterine fibroid, less likely appendicitis or colitis. ? ?Urinalysis pending ?Urine pregnancy pending ?CBC pending ?C-Met does not show  any evidence of electrolyte abnormality or kidney injury ?Lipase pending ?Toradol 30 mg IM x1 ?Pelvic ultrasound pending ? ?Handoff given to Dr. Bertrum Sol at 1215 ? ? ? ?FINAL CLINICAL IMPRESSION(S) / ED DIAGNOSES  ? ?Final diagnoses:  ?None  ? ? ? ?Rx / DC Orders  ? ?ED Discharge Orders   ? ? None  ? ?  ? ? ? ?Note:  This document was prepared using Dragon voice recognition software and may include unintentional dictation errors. ? ?  ?Jearld Fenton, NP ?05/19/21 1216 ? ?  ?Lavonia Drafts, MD ?05/19/21 1220 ? ?

## 2021-05-19 NOTE — ED Notes (Signed)
OR called for report

## 2021-05-20 ENCOUNTER — Encounter: Payer: Self-pay | Admitting: Obstetrics and Gynecology

## 2021-05-21 LAB — SURGICAL PATHOLOGY

## 2021-05-22 NOTE — Anesthesia Postprocedure Evaluation (Signed)
Anesthesia Post Note ? ?Patient: Tammy Hansen ? ?Procedure(s) Performed: LAPAROSCOPY DIAGNOSTIC POSSIBLE OOPHERECTOMY (Right) ? ?Patient location during evaluation: PACU ?Anesthesia Type: General ?Level of consciousness: awake and alert ?Pain management: pain level controlled ?Vital Signs Assessment: post-procedure vital signs reviewed and stable ?Respiratory status: spontaneous breathing, nonlabored ventilation, respiratory function stable and patient connected to nasal cannula oxygen ?Cardiovascular status: blood pressure returned to baseline and stable ?Postop Assessment: no apparent nausea or vomiting ?Anesthetic complications: no ? ? ?No notable events documented. ? ? ?Last Vitals:  ?Vitals:  ? 05/19/21 2115 05/19/21 2132  ?BP: 125/88 125/86  ?Pulse: 73 73  ?Resp: 16 16  ?Temp: (!) 36.2 ?C (!) 36.2 ?C  ?SpO2: 97% 97%  ?  ?Last Pain:  ?Vitals:  ? 05/19/21 2132  ?TempSrc: Temporal  ?PainSc: 3   ? ? ?  ?  ?  ?  ?  ?  ? ?Yevette Edwards ? ? ? ? ?

## 2021-06-05 ENCOUNTER — Ambulatory Visit (INDEPENDENT_AMBULATORY_CARE_PROVIDER_SITE_OTHER): Payer: BC Managed Care – PPO | Admitting: Obstetrics and Gynecology

## 2021-06-05 ENCOUNTER — Encounter: Payer: Self-pay | Admitting: Obstetrics and Gynecology

## 2021-06-05 VITALS — BP 116/80 | HR 79 | Ht 63.0 in | Wt 251.0 lb

## 2021-06-05 DIAGNOSIS — Z9889 Other specified postprocedural states: Secondary | ICD-10-CM

## 2021-06-05 NOTE — Progress Notes (Signed)
HPI: ?     [name] is a 46 y.o. [phone] who LMP was Patient's last menstrual period was 04/24/2021 (approximate). ? ?Subjective:  ? ?She presents today approximately 2 weeks from oophorectomy for torsion.  She reports that she is not doing well.  She has some persistent nausea but no vomiting.  She has been at work for the last week.  She is having some abdominal discomfort but not significant pain.  She is having bowel movements and voiding without difficulty. ? ?  Hx: ?The following portions of the patient's history were reviewed and updated as appropriate: ?            She  has a past medical history of Asthma, Degenerative disc disease, cervical, Degenerative disc disease, lumbar, and Migraines. ?She does not have a problem list on file. ?She  has a past surgical history that includes Cesarean section; Tonsillectomy; Tendons replaced (Left); and laparoscopy (Right, 05/19/2021). ?Her family history includes COPD in her father and mother; Diabetes in her mother; Heart failure in her mother; Hyperlipidemia in her mother. ?She  reports that she has never smoked. She has never used smokeless tobacco. She reports that she does not currently use alcohol after a past usage of about 1.0 standard drink per week. She reports that she does not use drugs. ?She has a current medication list which includes the following prescription(s): levocetirizine and montelukast. ?She is allergic to morphine and related and mucinex [guaifenesin er]. ?      ?Review of Systems:  ?Review of Systems ? ?Constitutional: Denied constitutional symptoms, night sweats, recent illness, fatigue, fever, insomnia and weight loss.  ?Eyes: Denied eye symptoms, eye pain, photophobia, vision change and visual disturbance.  ?Ears/Nose/Throat/Neck: Denied ear, nose, throat or neck symptoms, hearing loss, nasal discharge, sinus congestion and sore throat.  ?Cardiovascular: Denied cardiovascular symptoms, arrhythmia, chest pain/pressure, edema,  exercise intolerance, orthopnea and palpitations.  ?Respiratory: Denied pulmonary symptoms, asthma, pleuritic pain, productive sputum, cough, dyspnea and wheezing.  ?Gastrointestinal: Denied, gastro-esophageal reflux, melena, nausea and vomiting.  ?Genitourinary: Denied genitourinary symptoms including symptomatic vaginal discharge, pelvic relaxation issues, and urinary complaints.  ?Musculoskeletal: Denied musculoskeletal symptoms, stiffness, swelling, muscle weakness and myalgia.  ?Dermatologic: Denied dermatology symptoms, rash and scar.  ?Neurologic: Denied neurology symptoms, dizziness, headache, neck pain and syncope.  ?Psychiatric: Denied psychiatric symptoms, anxiety and depression.  ?Endocrine: Denied endocrine symptoms including hot flashes and night sweats.  ? ?Meds: ?  ?Current Outpatient Medications on File Prior to Visit  ?Medication Sig Dispense Refill  ? levocetirizine (XYZAL) 5 MG tablet Take 5 mg by mouth daily.    ? montelukast (SINGULAIR) 10 MG tablet Take 10 mg by mouth daily.    ? ?No current facility-administered medications on file prior to visit.  ? ? ? ? ?Objective:  ?  ? ?Vitals:  ? 06/05/21 1417  ?BP: 116/80  ?Pulse: 79  ? ?Filed Weights  ? 06/05/21 1417  ?Weight: 251 lb (113.9 kg)  ? ?  ?          ?Abdomen: Soft.  Non-tender.  No masses.  No HSM.  ?Incision/s: Intact.  Healing well.  No erythema.  No drainage.  ? ? ?        ? ?Assessment:  ?  ?[id number] ?There are no problems to display for this patient. ? ?  ?1. Postoperative state   ? ? Doing well postop. ? ? ?Plan:  ?  ?       ? 1.  Pathology discussed with patient. ? 2.  Patient to return in 1 to 2 months for annual exam Pap smear etc. ?Orders ?No orders of the defined types were placed in this encounter. ? ? No orders of the defined types were placed in this encounter. ?  ?  F/U ? Return in about 7 weeks (around 07/24/2021) for Annual Physical. ? ?Finis Bud, M.D. ?06/05/2021 ?3:00 PM ? ? ? ? ?

## 2021-06-05 NOTE — Progress Notes (Signed)
Patient presents today for surgery follow-up for ovarian torsion. She states tenderness near her incisions but states no bleeding or issues with her incisions. She states she has been nauseated since surgery, not taking any medication for this at this time.Patient states no other questions or concerns at this time.  ?

## 2021-08-01 ENCOUNTER — Encounter: Payer: BC Managed Care – PPO | Admitting: Obstetrics and Gynecology

## 2021-09-17 ENCOUNTER — Ambulatory Visit (INDEPENDENT_AMBULATORY_CARE_PROVIDER_SITE_OTHER): Payer: BC Managed Care – PPO | Admitting: Obstetrics and Gynecology

## 2021-09-17 ENCOUNTER — Other Ambulatory Visit (HOSPITAL_COMMUNITY)
Admission: RE | Admit: 2021-09-17 | Discharge: 2021-09-17 | Disposition: A | Discharge status: Home / Self Care | Payer: BC Managed Care – PPO | Source: Ambulatory Visit | Attending: Obstetrics and Gynecology | Admitting: Obstetrics and Gynecology

## 2021-09-17 ENCOUNTER — Other Ambulatory Visit: Payer: Self-pay | Admitting: Obstetrics and Gynecology

## 2021-09-17 ENCOUNTER — Encounter: Payer: Self-pay | Admitting: Obstetrics and Gynecology

## 2021-09-17 VITALS — BP 132/81 | HR 80 | Ht 63.0 in | Wt 249.1 lb

## 2021-09-17 DIAGNOSIS — Z1231 Encounter for screening mammogram for malignant neoplasm of breast: Secondary | ICD-10-CM

## 2021-09-17 DIAGNOSIS — Z01419 Encounter for gynecological examination (general) (routine) without abnormal findings: Secondary | ICD-10-CM | POA: Diagnosis present

## 2021-09-17 DIAGNOSIS — Z124 Encounter for screening for malignant neoplasm of cervix: Secondary | ICD-10-CM | POA: Insufficient documentation

## 2021-09-17 NOTE — Progress Notes (Signed)
HPI:      [name] is a 45 y.o. [phone] who LMP was No LMP recorded (lmp unknown).  Subjective:   She presents today for her annual examination.  She states that she is generally doing well.  She has occasional abdominal tenderness that has not resolved since her surgery.  It is not at her incision sites but just generalized tenderness.  She also states her periods have become irregular but are not as heavy as they used to be.  She states that she has had a "hot flashes all of her life".    Hx: The following portions of the patient's history were reviewed and updated as appropriate:             She  has a past medical history of Asthma, Degenerative disc disease, cervical, Degenerative disc disease, lumbar, and Migraines. She does not have a problem list on file. She  has a past surgical history that includes Cesarean section; Tonsillectomy; Tendons replaced (Left); and laparoscopy (Right, 05/19/2021). Her family history includes COPD in her father and mother; Diabetes in her mother; Heart failure in her mother; Hyperlipidemia in her mother. She  reports that she has never smoked. She has never used smokeless tobacco. She reports that she does not currently use alcohol after a past usage of about 1.0 standard drink of alcohol per week. She reports that she does not use drugs. She has a current medication list which includes the following prescription(s): levocetirizine and montelukast. She is allergic to morphine and related and mucinex [guaifenesin er].       Review of Systems:  Review of Systems  Constitutional: Denied constitutional symptoms, night sweats, recent illness, fatigue, fever, insomnia and weight loss.  Eyes: Denied eye symptoms, eye pain, photophobia, vision change and visual disturbance.  Ears/Nose/Throat/Neck: Denied ear, nose, throat or neck symptoms, hearing loss, nasal discharge, sinus congestion and sore throat.  Cardiovascular: Denied cardiovascular symptoms,  arrhythmia, chest pain/pressure, edema, exercise intolerance, orthopnea and palpitations.  Respiratory: Denied pulmonary symptoms, asthma, pleuritic pain, productive sputum, cough, dyspnea and wheezing.  Gastrointestinal: See HPI for additional information.  Genitourinary: Denied genitourinary symptoms including symptomatic vaginal discharge, pelvic relaxation issues, and urinary complaints.  Musculoskeletal: Denied musculoskeletal symptoms, stiffness, swelling, muscle weakness and myalgia.  Dermatologic: Denied dermatology symptoms, rash and scar.  Neurologic: Denied neurology symptoms, dizziness, headache, neck pain and syncope.  Psychiatric: Denied psychiatric symptoms, anxiety and depression.  Endocrine: Denied endocrine symptoms including hot flashes and night sweats.   Meds:   Current Outpatient Medications on File Prior to Visit  Medication Sig Dispense Refill   levocetirizine (XYZAL) 5 MG tablet Take 5 mg by mouth daily.     montelukast (SINGULAIR) 10 MG tablet Take 10 mg by mouth daily.     No current facility-administered medications on file prior to visit.     Objective:     Vitals:   09/17/21 1114  BP: 132/81  Pulse: 80    Filed Weights   09/17/21 1114  Weight: 249 lb 1.6 oz (113 kg)              Physical examination General NAD, Conversant  HEENT Atraumatic; Op clear with mmm.  Normo-cephalic. Pupils reactive. Anicteric sclerae  Thyroid/Neck Smooth without nodularity or enlargement. Normal ROM.  Neck Supple.  Skin No rashes, lesions or ulceration. Normal palpated skin turgor. No nodularity.  Breasts: No masses or discharge.  Symmetric.  No axillary adenopathy.  Lungs: Clear to auscultation.No rales or wheezes.  Normal Respiratory effort, no retractions.  Heart: NSR.  No murmurs or rubs appreciated. No periferal edema  Abdomen: Soft.  Non-tender.  No masses.  No HSM. No hernia  Extremities: Moves all appropriately.  Normal ROM for age. No lymphadenopathy.   Neuro: Oriented to PPT.  Normal mood. Normal affect.     Pelvic:   Vulva: Normal appearance.  No lesions.  Vagina: No lesions or abnormalities noted.  Support: Normal pelvic support.  Urethra No masses tenderness or scarring.  Meatus Normal size without lesions or prolapse.  Cervix: Normal appearance.  No lesions.  Anus: Normal exam.  No lesions.  Perineum: Normal exam.  No lesions.        Bimanual   Uterus: Normal size.  Non-tender.  Mobile.  AV.  Adnexae: No masses.  Non-tender to palpation.  Cul-de-sac: Negative for abnormality.     Assessment:    [id number] There are no problems to display for this patient.    1. Well woman exam with routine gynecological exam   2. Screening mammogram for breast cancer   3. Cervical cancer screening     Patient generally doing well.  Has some abdominal pain-expect resolution.  Possibly climacteric with irregular menses.  Doubt menopausal.   Plan:            1.  Basic Screening Recommendations The basic screening recommendations for asymptomatic women were discussed with the patient during her visit.  The age-appropriate recommendations were discussed with her and the rational for the tests reviewed.  When I am informed by the patient that another primary care physician has previously obtained the age-appropriate tests and they are up-to-date, only outstanding tests are ordered and referrals given as necessary.  Abnormal results of tests will be discussed with her when all of her results are completed.  Routine preventative health maintenance measures emphasized: Exercise/Diet/Weight control, Tobacco Warnings, Alcohol/Substance use risks and Stress Management Pap performed-mammogram ordered-patient to return for fasting blood work.  Orders Orders Placed This Encounter  Procedures   MM DIGITAL SCREENING BILATERAL   Basic metabolic panel   CBC   TSH   Lipid panel   Hemoglobin A1c    No orders of the defined types were placed in this  encounter.        F/U  Return in about 1 year (around 09/18/2022) for Annual Physical.  Elonda Husky, M.D. 09/17/2021 11:57 AM

## 2021-09-17 NOTE — Progress Notes (Signed)
Patients presents for annual exam today. She states irregular cycles with no cycle control, reports skipping months since surgery in March.She reports still feeling sore around her incisions since oophorectomy. Patient is due for pap smear, ordered. Patient is due for mammogram, ordered. Patients annual labs are ordered, will come back when fasting. Patient states no other questions or concerns at this time.

## 2021-09-18 ENCOUNTER — Other Ambulatory Visit: Payer: BC Managed Care – PPO

## 2021-09-18 DIAGNOSIS — Z01419 Encounter for gynecological examination (general) (routine) without abnormal findings: Secondary | ICD-10-CM

## 2021-09-19 LAB — LIPID PANEL
Chol/HDL Ratio: 4.3 ratio (ref 0.0–4.4)
Cholesterol, Total: 166 mg/dL (ref 100–199)
HDL: 39 mg/dL — ABNORMAL LOW (ref >39)
LDL Chol Calc (NIH): 105 mg/dL — ABNORMAL HIGH (ref 0–99)
Triglycerides: 121 mg/dL (ref 0–149)
VLDL Cholesterol Cal: 22 mg/dL (ref 5–40)

## 2021-09-19 LAB — BASIC METABOLIC PANEL
BUN/Creatinine Ratio: 20 (ref 9–23)
BUN: 13 mg/dL (ref 6–24)
CO2: 25 mmol/L (ref 20–29)
Calcium: 9.6 mg/dL (ref 8.7–10.2)
Chloride: 101 mmol/L (ref 96–106)
Creatinine, Ser: 0.64 mg/dL (ref 0.57–1.00)
Glucose: 96 mg/dL (ref 70–99)
Potassium: 4.4 mmol/L (ref 3.5–5.2)
Sodium: 141 mmol/L (ref 134–144)
eGFR: 110 mL/min/{1.73_m2} (ref >59)

## 2021-09-19 LAB — CBC
Hematocrit: 37.5 % (ref 34.0–46.6)
Hemoglobin: 12.2 g/dL (ref 11.1–15.9)
MCH: 26.5 pg — ABNORMAL LOW (ref 26.6–33.0)
MCHC: 32.5 g/dL (ref 31.5–35.7)
MCV: 82 fL (ref 79–97)
Platelets: 253 10*3/uL (ref 150–450)
RBC: 4.6 x10E6/uL (ref 3.77–5.28)
RDW: 13.8 % (ref 11.7–15.4)
WBC: 6.9 10*3/uL (ref 3.4–10.8)

## 2021-09-19 LAB — TSH: TSH: 1.5 u[IU]/mL (ref 0.450–4.500)

## 2021-09-19 LAB — HEMOGLOBIN A1C
Est. average glucose Bld gHb Est-mCnc: 131 mg/dL
Hgb A1c MFr Bld: 6.2 % — ABNORMAL HIGH (ref 4.8–5.6)

## 2021-09-23 LAB — CYTOLOGY - PAP
Comment: NEGATIVE
Diagnosis: HIGH — AB
High risk HPV: NEGATIVE

## 2021-10-09 ENCOUNTER — Ambulatory Visit
Admission: RE | Admit: 2021-10-09 | Discharge: 2021-10-09 | Disposition: A | Discharge status: Home / Self Care | Payer: BC Managed Care – PPO | Source: Ambulatory Visit | Attending: Obstetrics and Gynecology | Admitting: Obstetrics and Gynecology

## 2021-10-09 DIAGNOSIS — Z1231 Encounter for screening mammogram for malignant neoplasm of breast: Secondary | ICD-10-CM | POA: Diagnosis not present

## 2022-02-07 ENCOUNTER — Emergency Department: Payer: 59

## 2022-02-07 ENCOUNTER — Other Ambulatory Visit: Payer: Self-pay

## 2022-02-07 ENCOUNTER — Emergency Department
Admission: EM | Admit: 2022-02-07 | Discharge: 2022-02-07 | Disposition: A | Discharge status: Home / Self Care | Payer: 59 | Attending: Emergency Medicine | Admitting: Emergency Medicine

## 2022-02-07 DIAGNOSIS — R748 Abnormal levels of other serum enzymes: Secondary | ICD-10-CM | POA: Diagnosis not present

## 2022-02-07 DIAGNOSIS — R110 Nausea: Secondary | ICD-10-CM | POA: Diagnosis not present

## 2022-02-07 DIAGNOSIS — R1032 Left lower quadrant pain: Secondary | ICD-10-CM | POA: Insufficient documentation

## 2022-02-07 DIAGNOSIS — R1012 Left upper quadrant pain: Secondary | ICD-10-CM | POA: Insufficient documentation

## 2022-02-07 DIAGNOSIS — R1030 Lower abdominal pain, unspecified: Secondary | ICD-10-CM

## 2022-02-07 LAB — URINALYSIS, ROUTINE W REFLEX MICROSCOPIC
Bilirubin Urine: NEGATIVE
Glucose, UA: NEGATIVE mg/dL
Ketones, ur: NEGATIVE mg/dL
Leukocytes,Ua: NEGATIVE
Nitrite: NEGATIVE
Protein, ur: 30 mg/dL — AB
RBC / HPF: >50 RBC/hpf — ABNORMAL HIGH (ref 0–5)
Specific Gravity, Urine: 1.024 (ref 1.005–1.030)
pH: 5 (ref 5.0–8.0)

## 2022-02-07 LAB — COMPREHENSIVE METABOLIC PANEL
ALT: 25 U/L (ref 0–44)
AST: 23 U/L (ref 15–41)
Albumin: 4.1 g/dL (ref 3.5–5.0)
Alkaline Phosphatase: 106 U/L (ref 38–126)
Anion gap: 6 (ref 5–15)
BUN: 16 mg/dL (ref 6–20)
CO2: 26 mmol/L (ref 22–32)
Calcium: 9.1 mg/dL (ref 8.9–10.3)
Chloride: 106 mmol/L (ref 98–111)
Creatinine, Ser: 0.63 mg/dL (ref 0.44–1.00)
GFR, Estimated: >60 mL/min (ref >60)
Glucose, Bld: 122 mg/dL — ABNORMAL HIGH (ref 70–99)
Potassium: 3.8 mmol/L (ref 3.5–5.1)
Sodium: 138 mmol/L (ref 135–145)
Total Bilirubin: 0.7 mg/dL (ref 0.3–1.2)
Total Protein: 7.8 g/dL (ref 6.5–8.1)

## 2022-02-07 LAB — LIPASE, BLOOD: Lipase: 72 U/L — ABNORMAL HIGH (ref 11–51)

## 2022-02-07 LAB — CBC
HCT: 40.2 % (ref 36.0–46.0)
Hemoglobin: 12.8 g/dL (ref 12.0–15.0)
MCH: 26.7 pg (ref 26.0–34.0)
MCHC: 31.8 g/dL (ref 30.0–36.0)
MCV: 83.8 fL (ref 80.0–100.0)
Platelets: 261 10*3/uL (ref 150–400)
RBC: 4.8 MIL/uL (ref 3.87–5.11)
RDW: 13 % (ref 11.5–15.5)
WBC: 6.4 10*3/uL (ref 4.0–10.5)
nRBC: 0 % (ref 0.0–0.2)

## 2022-02-07 LAB — TROPONIN I (HIGH SENSITIVITY)
Troponin I (High Sensitivity): 2 ng/L (ref <18)
Troponin I (High Sensitivity): <2 ng/L (ref <18)

## 2022-02-07 LAB — POC URINE PREG, ED: Preg Test, Ur: NEGATIVE

## 2022-02-07 LAB — TRIGLYCERIDES: Triglycerides: 99 mg/dL (ref <150)

## 2022-02-07 MED ORDER — IOHEXOL 300 MG/ML  SOLN
100.0000 mL | Freq: Once | INTRAMUSCULAR | Status: AC | PRN
Start: 2022-02-07 — End: 2022-02-07
  Administered 2022-02-07: 100 mL via INTRAVENOUS

## 2022-02-07 MED ORDER — ONDANSETRON HCL 4 MG/2ML IJ SOLN
4.0000 mg | Freq: Once | INTRAMUSCULAR | Status: AC
  Administered 2022-02-07: 4 mg via INTRAVENOUS
  Filled 2022-02-07: qty 2

## 2022-02-07 MED ORDER — SODIUM CHLORIDE 0.9 % IV BOLUS
1000.0000 mL | Freq: Once | INTRAVENOUS | Status: AC
  Administered 2022-02-07: 1000 mL via INTRAVENOUS

## 2022-02-07 NOTE — ED Notes (Signed)
Patient declined discharge vital signs. 

## 2022-02-07 NOTE — ED Notes (Signed)
NS bolus continued at this time.

## 2022-02-07 NOTE — ED Provider Notes (Signed)
[facility] Provider Note    Event Date/Time   First MD Initiated Contact with Patient 02/07/22 1233     (approximate)   History   Abdominal Pain   HPI  Tammy Hansen is a 46 y.o. female with history of right ovarian torsion who comes in with concerns for left upper abdominal discomfort.  Patient reports pain that started this morning with nausea but no vomiting diarrhea or constipation.  She denies ever having this pain previously.  She does report history of ovarian torsion requiring surgery but denies this feeling similarly.  She denies any history of kidney stones and reports that she is currently on her menstruation.  Denies any chest pain or shortness of breath.  She reports the pain come on very suddenly overnight and that since then the pain has come down but occasionally will come back more mildly but the worst pain was earlier today.   Physical Exam   Triage Vital Signs: ED Triage Vitals  Enc Vitals Group     BP 02/07/22 1145 128/83     Pulse Rate 02/07/22 1145 73     Resp 02/07/22 1145 18     Temp 02/07/22 1145 97.8 F (36.6 C)     Temp Source 02/07/22 1145 Oral     SpO2 02/07/22 1145 100 %     Weight 02/07/22 1143 249 lb (112.9 kg)     Height 02/07/22 1143 5\' 3"  (1.6 m)     Head Circumference --      Peak Flow --      Pain Score 02/07/22 1143 10     Pain Loc --      Pain Edu? --      Excl. in GC? --     Most recent vital signs: Vitals:   02/07/22 1145  BP: 128/83  Pulse: 73  Resp: 18  Temp: 97.8 F (36.6 C)  SpO2: 100%     General: Awake, no distress.  CV:  Good peripheral perfusion.  Resp:  Normal effort.  Abd:  No distention.  Patient has some tenderness in the left upper quadrant but without any rash noted.  No rebound or guarding. Other:     ED Results / Procedures / Treatments   Labs (all labs ordered are listed, but only abnormal results are displayed) Labs Reviewed  LIPASE, BLOOD - Abnormal; Notable for  the following components:      Result Value   Lipase 72 (*)    All other components within normal limits  COMPREHENSIVE METABOLIC PANEL - Abnormal; Notable for the following components:   Glucose, Bld 122 (*)    All other components within normal limits  URINALYSIS, ROUTINE W REFLEX MICROSCOPIC - Abnormal; Notable for the following components:   Color, Urine YELLOW (*)    APPearance HAZY (*)    Hgb urine dipstick LARGE (*)    Protein, ur 30 (*)    RBC / HPF >50 (*)    Bacteria, UA RARE (*)    All other components within normal limits  CBC  TRIGLYCERIDES  POC URINE PREG, ED     EKG  My interpretation of EKG:  Normal sinus rate of 73 without any ST elevation or T wave inversions, normal intervals  RADIOLOGY I have reviewed the ultrasound personally interpreted and patient has some hepatic steatosis but no signs of gallstones   PROCEDURES:  Critical Care performed: No  Procedures   MEDICATIONS ORDERED IN ED: Medications  sodium chloride  0.9 % bolus 1,000 mL (1,000 mLs Intravenous New Bag/Given 02/07/22 1305)  ondansetron (ZOFRAN) injection 4 mg (4 mg Intravenous Given 02/07/22 1306)  iohexol (OMNIPAQUE) 300 MG/ML solution 100 mL (100 mLs Intravenous Contrast Given 02/07/22 1412)     IMPRESSION / MDM / ASSESSMENT AND PLAN / ED COURSE  I reviewed the triage vital signs and the nursing notes.   Patient's presentation is most consistent with acute presentation with potential threat to life or bodily function.   Patient comes in with upper abdominal pain lipase slightly elevated but not 2 times normal to suggest pancreatitis.  Will get ultrasound to evaluate for gallstones, CT scan to evaluate for any thing else given patient does have a history of ovarian torsion to make sure there is no large cyst, kidney stone, pancreatitis etc.  Patient declined pain medication given some fluids and Zofran  Ultrasound reassuring patient provided copy of report   IMPRESSION: No acute  finding in the abdomen or pelvis.  She does have a 2 cm cyst in the left adnexal cyst with no imaging needed  Given patient does have a history of ovarian torsion without cyst being significantly present I have discussed with patient getting an ultrasound just to ensure no evidence of recurrent torsion.  She does report that she has not interested in having any children.  Tropes are negative x2.  Patient ultrasound does show blood flow to the left ovary.  On repeat assessment she is not really having any pain at this time.  When she does have pain it is more of her left mid abdomen not really lower in nature so I am not sure if this could be related to the ovary or not.  She has declined pain medications throughout her stay here.  We discussed the possibility of intermittent torsion given I did review her op note from her right torsion where she had no cyst and had spontaneous torsion which is exceedingly rare.  Therefore we discussed about this potentially being some intermittent torsion.  However at this time given no pain I do not feel like she is currently torsed.  She has no desire for future children and I have offered to consult OB/GYN and have her follow-up outpatient with them but she has declined.  I am not even 100% sure if this pain is from the ovary given it seems more higher and she reports that the pain is not similar to her torsion in the past.  But given we have no other findings I have discussed this with patient that I would be worried that something could be going on with her left ovary.  Given she is without pain at this time she would prefer to go home and she will return to the ER if the pain comes back and is willing to have a repeat ultrasound done in with OB.  She understands the risk of loosing her ovary but again she does not want future children and would prefer to leave at this time and given no pain and no torsion on [place] I think that is reasonable.   Incidental findings provided  to patient  Given the Dr. Logan Bores who did her surgery previously was on-call I did send him a message to help facilitate outpatient follow-up given patient was not willing to let me do this.  He was agreeable to follow-up with patient outpatient   FINAL CLINICAL IMPRESSION(S) / ED DIAGNOSES   Final diagnoses:  Lower abdominal pain  Rx / DC Orders   ED Discharge Orders     None        Note:  This document was prepared using Dragon voice recognition software and may include unintentional dictation errors.   Concha Se, MD 02/07/22 [phone]

## 2022-02-07 NOTE — ED Triage Notes (Signed)
Complains of LUQ abd pain that started this am.  +nausea but not vomiting diarrhea or constipation.  No urinary symptoms.

## 2022-02-07 NOTE — Discharge Instructions (Addendum)
We discussed the possibility of intermittent ovarian torsion.  Currently there is blood flow to your ovary and you have no pain but given your history if he developed return of severe pain you should return to the ER for repeat evaluation otherwise Dr. Logan Bores will follow up with you outpatient.  Your incidental findings are as below.   IMPRESSION: 1. Hepatic steatosis. 2. No evidence of gallstones or biliary dilatation.  IMPRESSION: 1. Interval right oophorectomy. 2. No significant change in complex nabothian cyst measuring up to 1.4 cm. 3. Small 5 mm hypoechoic focus seen in the region of the endometrial canal near the uterine fundus is nonspecific and may represent a small cyst.

## 2022-12-02 DIAGNOSIS — R7303 Prediabetes: Secondary | ICD-10-CM

## 2022-12-02 HISTORY — DX: Prediabetes: R73.03

## 2023-01-29 ENCOUNTER — Other Ambulatory Visit: Payer: Self-pay

## 2023-01-29 ENCOUNTER — Emergency Department
Admission: EM | Admit: 2023-01-29 | Discharge: 2023-01-29 | Disposition: A | Discharge status: Home / Self Care | Payer: 59 | Attending: Emergency Medicine | Admitting: Emergency Medicine

## 2023-01-29 DIAGNOSIS — B372 Candidiasis of skin and nail: Secondary | ICD-10-CM | POA: Insufficient documentation

## 2023-01-29 DIAGNOSIS — J45909 Unspecified asthma, uncomplicated: Secondary | ICD-10-CM | POA: Insufficient documentation

## 2023-01-29 MED ORDER — KETOCONAZOLE 2 % EX CREA: 1.0000 | TOPICAL_CREAM | Freq: Two times a day (BID) | CUTANEOUS | 1 refills | Status: DC

## 2023-01-29 NOTE — ED Notes (Signed)
See triage note. Skin redness and inflammation at right lateral aspect of C-section scar. Pt states when she first noticed it there was a clear, foul smelling liquid on her hand when she touched it. No evident open wound at time of assessment. Pt A&Ox4, NAD.

## 2023-01-29 NOTE — Discharge Instructions (Addendum)
Keep the area along the skin folds clean and dry.  Use the prescription antifungal cream as directed.  Consider using a old white T-shirt to prevent the skin from folding over on itself.  Follow-up with your primary provider for ongoing evaluation.  Return to the ED if necessary.

## 2023-01-29 NOTE — ED Provider Notes (Signed)
[facility] Emergency Department Provider Note     Event Date/Time   First MD Initiated Contact with Patient 01/29/23 1937     (approximate)   History   Skin Irritation   HPI  Tammy Hansen is a 47 y.o. female with a history of asthma, migraines, DDD, and obesity, presents to the ED for evaluation of "leaking" from her C-section scar.  She reports has been 15 years since her initial C-section.  She reports some skin irritation under the skin folds near that scar.  She also reports a foul-smelling drainage from the area.  She denies any fevers, chills, or sweats.  She applied some OTC Boudreau's Butt paste to the area prior to arrival.   Physical Exam   Triage Vital Signs: ED Triage Vitals [01/29/23 1932]  Encounter Vitals Group     BP (!) 114/95     Systolic BP Percentile      Diastolic BP Percentile      Pulse Rate 80     Resp 16     Temp 97.7 F (36.5 C)     Temp Source Oral     SpO2 100 %     Weight 237 lb (107.5 kg)     Height 5\' 2"  (1.575 m)     Head Circumference      Peak Flow      Pain Score 0     Pain Loc      Pain Education      Exclude from Growth Chart     Most recent vital signs: Vitals:   01/29/23 1932 01/29/23 2115  BP: (!) 114/95 121/83  Pulse: 80 81  Resp: 16 18  Temp: 97.7 F (36.5 C)   SpO2: 100% 100%    General Awake, no distress. NAD CV:  Good peripheral perfusion.  RESP:  Normal effort.  ABD:  No distention.  SKIN:  Evidence along the C-section scar obscured by her abdominal pannus, revealing some local irritation to the lateral aspect of her C-section scar.  No wound dehiscence is appreciated.  Some erythema and some denuded of the skin is noted.  No purulent drainage noted at this time.   ED Results / Procedures / Treatments   Labs (all labs ordered are listed, but only abnormal results are displayed) Labs Reviewed - No data to display   EKG   RADIOLOGY  No results  found.   PROCEDURES:  Critical Care performed: No  Procedures   MEDICATIONS ORDERED IN ED: Medications - No data to display   IMPRESSION / MDM / ASSESSMENT AND PLAN / ED COURSE  I reviewed the triage vital signs and the nursing notes.                              Differential diagnosis includes, but is not limited to, wound dehiscence, cellulitis, incisional hernia, intertriginous yeast infection  Patient's presentation is most consistent with acute, uncomplicated illness.  Patient's diagnosis is consistent with yeast dermatitis. Patient will be discharged home with prescriptions for ketoconazole ointment.  Is also given instructions on management of intertriginous yeast infection including keeping area clean and dry, and limiting skin on skin contact.  Patient is to follow up with primary provider as discussed, as needed or otherwise directed. Patient is given ED precautions to return to the ED for any worsening or new symptoms.   FINAL CLINICAL IMPRESSION(S) / ED DIAGNOSES  Final diagnoses:  Yeast dermatitis     Rx / DC Orders   ED Discharge Orders          Ordered    ketoconazole (NIZORAL) 2 % cream  2 times daily        01/29/23 2045             Note:  This document was prepared using Dragon voice recognition software and may include unintentional dictation errors.    Lissa Hoard, PA-C 01/29/23 2122    Janith Lima, MD 02/02/23 [phone]

## 2023-01-29 NOTE — ED Triage Notes (Signed)
Pt states her c section scar is "leaking." Pt states she had her c section 15 years ago. Pt has some skin irritation under skin fold near c section scar. Pt reports foul smelling odor coming from area. No open wounds noted.

## 2023-04-02 ENCOUNTER — Encounter: Payer: Self-pay | Admitting: Emergency Medicine

## 2023-04-02 ENCOUNTER — Emergency Department
Admission: EM | Admit: 2023-04-02 | Discharge: 2023-04-02 | Disposition: A | Discharge status: Home / Self Care | Payer: Self-pay | Attending: Emergency Medicine | Admitting: Emergency Medicine

## 2023-04-02 ENCOUNTER — Other Ambulatory Visit: Payer: Self-pay

## 2023-04-02 ENCOUNTER — Emergency Department: Payer: Self-pay

## 2023-04-02 DIAGNOSIS — M5432 Sciatica, left side: Secondary | ICD-10-CM

## 2023-04-02 DIAGNOSIS — M9973 Connective tissue and disc stenosis of intervertebral foramina of lumbar region: Secondary | ICD-10-CM | POA: Insufficient documentation

## 2023-04-02 DIAGNOSIS — M5441 Lumbago with sciatica, right side: Secondary | ICD-10-CM | POA: Insufficient documentation

## 2023-04-02 DIAGNOSIS — W19XXXA Unspecified fall, initial encounter: Secondary | ICD-10-CM | POA: Insufficient documentation

## 2023-04-02 DIAGNOSIS — M5442 Lumbago with sciatica, left side: Secondary | ICD-10-CM | POA: Insufficient documentation

## 2023-04-02 DIAGNOSIS — M9979 Connective tissue and disc stenosis of intervertebral foramina of abdomen and other regions: Secondary | ICD-10-CM

## 2023-04-02 MED ORDER — DICLOFENAC SODIUM 75 MG PO TBEC: 75.0000 mg | DELAYED_RELEASE_TABLET | Freq: Two times a day (BID) | ORAL | 1 refills | Status: DC

## 2023-04-02 MED ORDER — TIZANIDINE HCL 4 MG PO TABS: 2.0000 mg | ORAL_TABLET | Freq: Three times a day (TID) | ORAL | 0 refills | Status: AC

## 2023-04-02 MED ORDER — KETOROLAC TROMETHAMINE 30 MG/ML IJ SOLN
30.0000 mg | Freq: Once | INTRAMUSCULAR | Status: AC
  Administered 2023-04-02: 30 mg via INTRAMUSCULAR
  Filled 2023-04-02: qty 1

## 2023-04-02 MED ORDER — HYDROCODONE-ACETAMINOPHEN 5-325 MG PO TABS: 1.0000 | ORAL_TABLET | Freq: Three times a day (TID) | ORAL | 0 refills | Status: AC | PRN

## 2023-04-02 MED ORDER — KETOROLAC TROMETHAMINE 10 MG PO TABS: 10.0000 mg | ORAL_TABLET | Freq: Three times a day (TID) | ORAL | 0 refills | Status: DC

## 2023-04-02 NOTE — Discharge Instructions (Addendum)
Take the prescription meds as directed.  Follow-up with orthospine as discussed.  Return to the ED if necessary.

## 2023-04-02 NOTE — ED Provider Notes (Signed)
[facility] Emergency Department Provider Note     Event Date/Time   First MD Initiated Contact with Patient 04/02/23 2119     (approximate)   History   Hip Pain   HPI  Tammy Hansen is a 48 y.o. female with a history of DDD, asthma, and migraines, presents to the ED for evaluation of worsening left hip pain with referral of the left leg after mechanical fall on Tuesday.  Patient denies any bladder or bowel incontinence, foot drop, saddle anesthesia.  Physical Exam   Triage Vital Signs: ED Triage Vitals  Encounter Vitals Group     BP 04/02/23 1929 130/88     Systolic BP Percentile --      Diastolic BP Percentile --      Pulse Rate 04/02/23 1929 95     Resp 04/02/23 1929 18     Temp 04/02/23 1929 97.6 F (36.4 C)     Temp Source 04/02/23 1929 Oral     SpO2 04/02/23 1929 96 %     Weight 04/02/23 1930 236 lb 15.9 oz (107.5 kg)     Height 04/02/23 1930 5\' 2"  (1.575 m)     Head Circumference --      Peak Flow --      Pain Score 04/02/23 1930 8     Pain Loc --      Pain Education --      Exclude from Growth Chart --     Most recent vital signs: Vitals:   04/02/23 1929  BP: 130/88  Pulse: 95  Resp: 18  Temp: 97.6 F (36.4 C)  SpO2: 96%    General Awake, no distress. NAD HEENT NCAT. PERRL. EOMI. No rhinorrhea. Mucous membranes are moist.  CV:  Good peripheral perfusion. RRR RESP:  Normal effort. CTA ABD:  No distention.  MSK:  Normal spinal alignment without midline tenderness, spasm, pulmonary, or step-off.  Active range of motion of lower extremities bilaterally. NEURO: Cranial nerves II to XII grossly intact.  Normal LE DTRs bilaterally.   ED Results / Procedures / Treatments   Labs (all labs ordered are listed, but only abnormal results are displayed) Labs Reviewed - No data to display   EKG   RADIOLOGY  I personally viewed and evaluated these images as part of my medical decision making, as well as reviewing the  written report by the radiologist.  ED Provider Interpretation: No acute fracture of the pelvis or hips.  Lumbar DDD with L5-S1 L>R foraminal stenosis  DG Hip Unilat W or Wo Pelvis 2-3 Views Left Result Date: 04/02/2023 CLINICAL DATA:  Left hip and leg pain.  Fall. EXAM: DG HIP (WITH OR WITHOUT PELVIS) 2-3V LEFT COMPARISON:  None Available. FINDINGS: There is no evidence of hip fracture or dislocation. There is no evidence of arthropathy or other focal bone abnormality. IMPRESSION: Negative. Electronically Signed   By: Charlett Nose M.D.   On: 04/02/2023 20:02   CT Lumbar Spine Wo Contrast Result Date: 04/02/2023 CLINICAL DATA:  Initial evaluation for acute trauma, fall, acute low back pain. EXAM: CT LUMBAR SPINE WITHOUT CONTRAST TECHNIQUE: Multidetector CT imaging of the lumbar spine was performed without intravenous contrast administration. Multiplanar CT image reconstructions were also generated. RADIATION DOSE REDUCTION: This exam was performed according to the departmental dose-optimization program which includes automated exposure control, adjustment of the mA and/or kV according to patient size and/or use of iterative reconstruction technique. COMPARISON:  Prior study from 01/02/2015. FINDINGS: Segmentation:  Standard. Lowest well-formed disc space labeled the L5-S1 level. Alignment: Physiologic with preservation of the normal lumbar lordosis. No listhesis. Vertebrae: Vertebral body height maintained without acute or chronic fracture. Visualized sacrum and pelvis intact. No worrisome osseous lesions. Paraspinal and other soft tissues: Paraspinous soft tissues demonstrate no acute finding. Visualized visceral structures within normal limits. Disc levels: No significant findings are seen through the L3-4 level. L4-5: Minimal disc bulge. Mild bilateral facet hypertrophy. No canal or foraminal stenosis. L5-S1: Degenerative intervertebral disc space narrowing with reactive endplate spurring. Broad-based  posterior disc bulge contacts the descending S1 nerve roots without frank neural impingement or displacement. No significant canal or lateral recess stenosis. Mild to moderate left worse than right L5 foraminal stenosis. IMPRESSION: 1. No acute osseous abnormality within the lumbar spine. 2. Degenerative disc bulging at L5-S1 with resultant mild to moderate left worse than right L5 foraminal stenosis. Electronically Signed   By: Rise Mu M.D.   On: 04/02/2023 20:02   PROCEDURES:  Critical Care performed: No  Procedures   MEDICATIONS ORDERED IN ED: Medications  ketorolac (TORADOL) 30 MG/ML injection 30 mg (30 mg Intramuscular Given 04/02/23 2142)     IMPRESSION / MDM / ASSESSMENT AND PLAN / ED COURSE  I reviewed the triage vital signs and the nursing notes.                              Differential diagnosis includes, but is not limited to, hip contusion, hip fracture, hip dislocation, lumbar strain, DDD, lumbar radiculopathy, myalgias  Patient's presentation is most consistent with acute complicated illness / injury requiring diagnostic workup.  Patient's diagnosis is consistent with fall with bilateral sciatica and evidence of foraminal stenosis.  Patient with ration exam and workup at this time.  No red flags are noted.  No evidence or concern for cauda equina or spinal cord compression.  CT imaging reviewed by me confirms patient underlying DDD.  Patient will be discharged home with prescriptions for diclofenac, hydrocodone, Toradol, and tizanidine. Patient is to follow up with Ortho as discussed, as needed or otherwise directed. Patient is given ED precautions to return to the ED for any worsening or new symptoms.     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    Lissa Hoard, PA-C 04/07/23 2358    Dionne Bucy, MD 04/08/23 2245

## 2023-04-02 NOTE — ED Triage Notes (Signed)
Patient ambulatory to triage with complaints of worsening left hip and leg pain after a fall last Tuesday. Patient states she has a burning sensation down the leg. Endorses previous hx of degenerative disc disease.

## 2023-04-02 NOTE — ED Provider Triage Note (Signed)
Emergency Medicine Provider Triage Evaluation Note  Tammy Hansen , a 48 y.o. female  was evaluated in triage.  Pt complains of history of fall and posterior lumbar pain and left hip pain in the last week.  Patient states radiation of the pain to her left tight.  Review of Systems  Positive:  Negative:   Physical Exam  BP 130/88   Pulse 95   Temp 97.6 F (36.4 C) (Oral)   Resp 18   Ht 5\' 2"  (1.575 m)   Wt 107.5 kg   SpO2 96%   BMI 43.35 kg/m  Gen:   Awake, no distress   Resp:  Normal effort MSK:   Moves extremities without difficulty  Other:    Medical Decision Making  Medically screening exam initiated at 7:31 PM.  Appropriate orders placed.  Tammy Hansen was informed that the remainder of the evaluation will be completed by another provider, this initial triage assessment does not replace that evaluation, and the importance of remaining in the ED until their evaluation is complete.  Patient with a fall, hip pain and radiculopathy ordered lumbar CT, hip x-ray   Gladys Damme, PA-C 04/02/23 1933

## 2023-05-02 IMAGING — US US PELVIS COMPLETE TRANSABD/TRANSVAG W DUPLEX AND/OR DOPPLER
1 series · 13 of 25 positions shown · non-contrast
Comparison: 02/07/2018

CLINICAL DATA: Pelvic pain

EXAM:
TRANSABDOMINAL AND TRANSVAGINAL ULTRASOUND OF PELVIS
DOPPLER ULTRASOUND OF OVARIES
TECHNIQUE: Both transabdominal and transvaginal ultrasound examinations of the
pelvis were performed. Transabdominal technique was performed for
global imaging of the pelvis including uterus, ovaries, adnexal
regions, and pelvic cul-de-sac.
It was necessary to proceed with endovaginal exam following the
transabdominal exam to visualize the uterus, endometrium, and
ovaries. Color and duplex Doppler ultrasound was utilized to
evaluate blood flow to the ovaries.

[Series 1: us pelvic complete w transvaginal and torsion righ · 121 acquisitions, 13 frames shown]
[im 1/121]
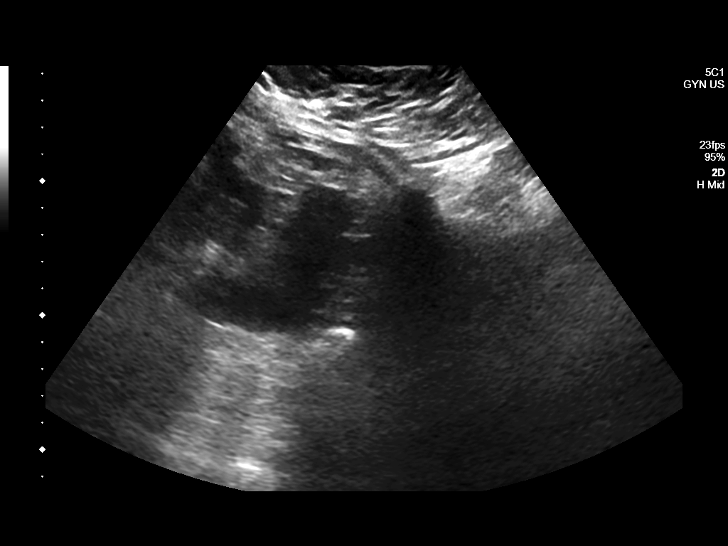
[im 11/121]
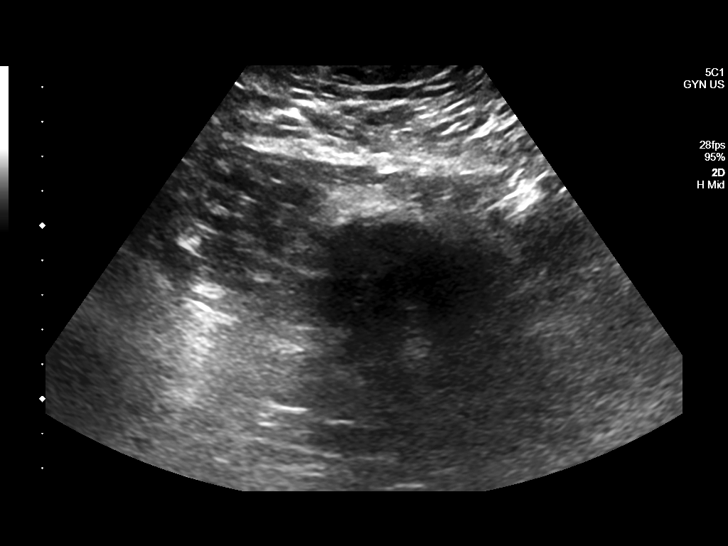
[im 21/121]
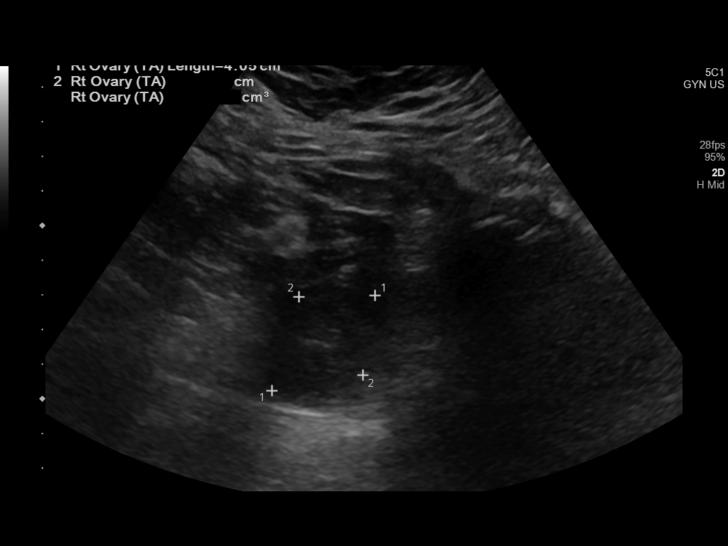
[im 31/121]
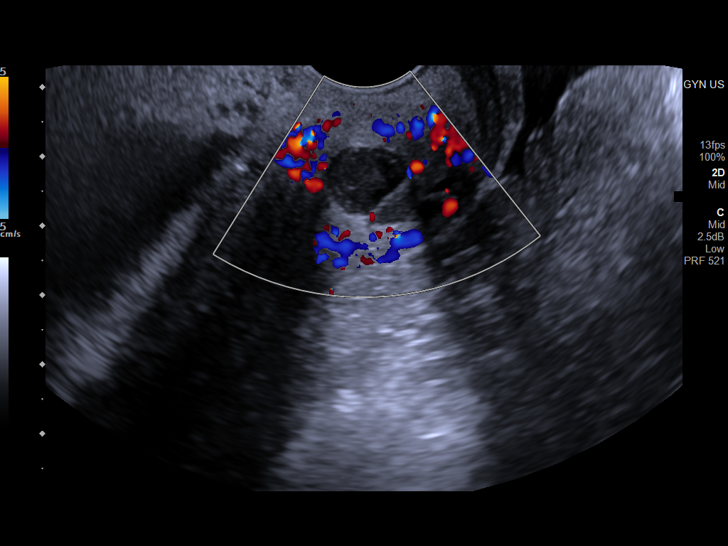
[im 41/121]
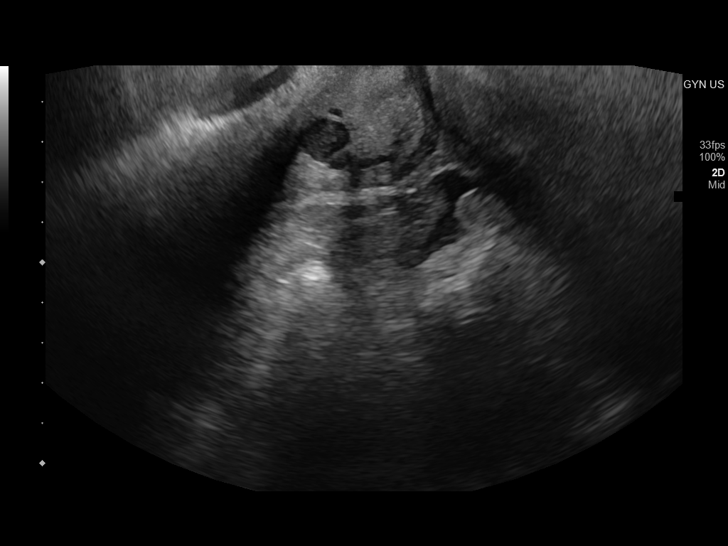
[im 51/121]
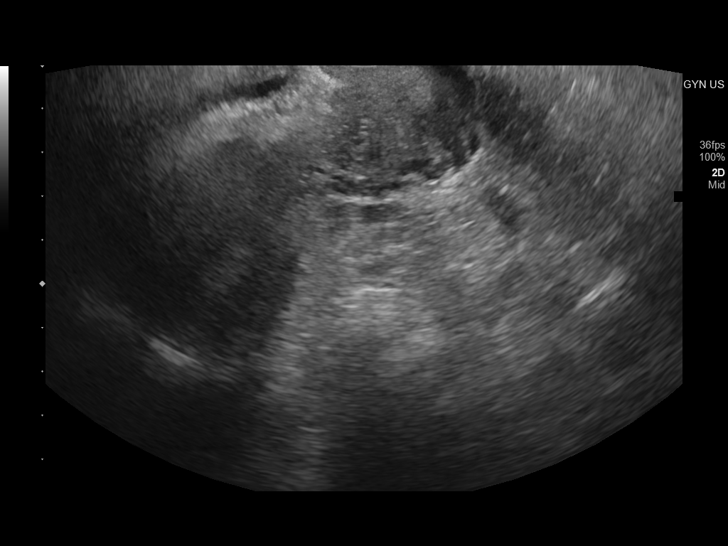
[im 61/121]
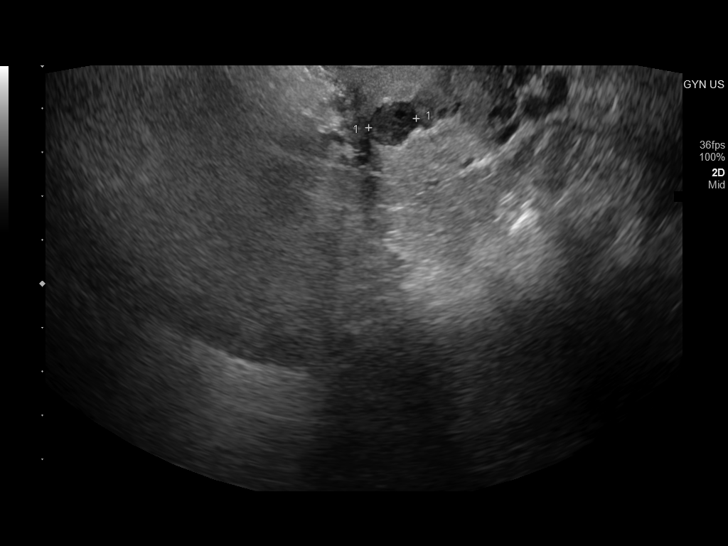
[im 71/121]
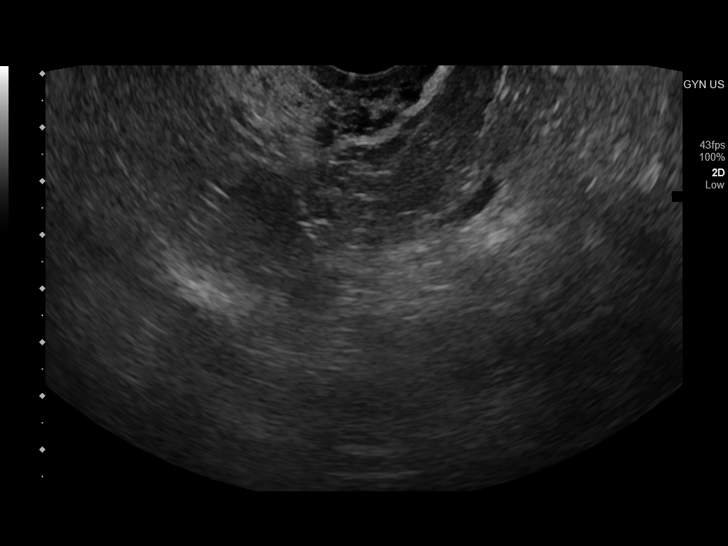
[im 81/121]
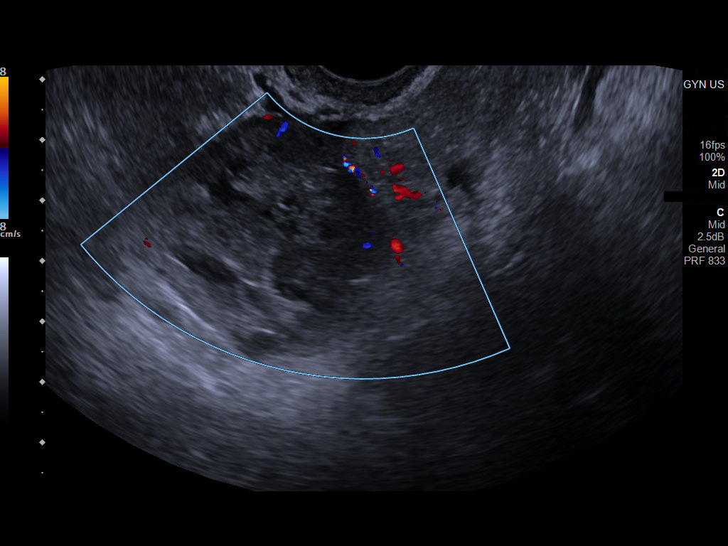
[im 91/121]
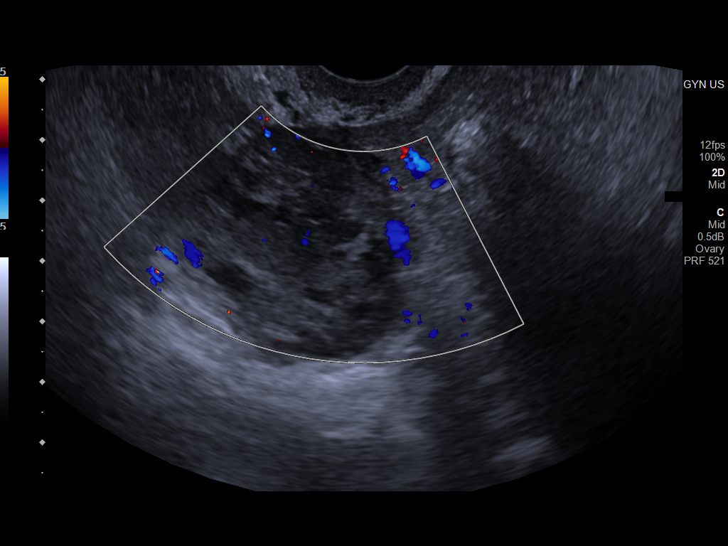
[im 101/121]
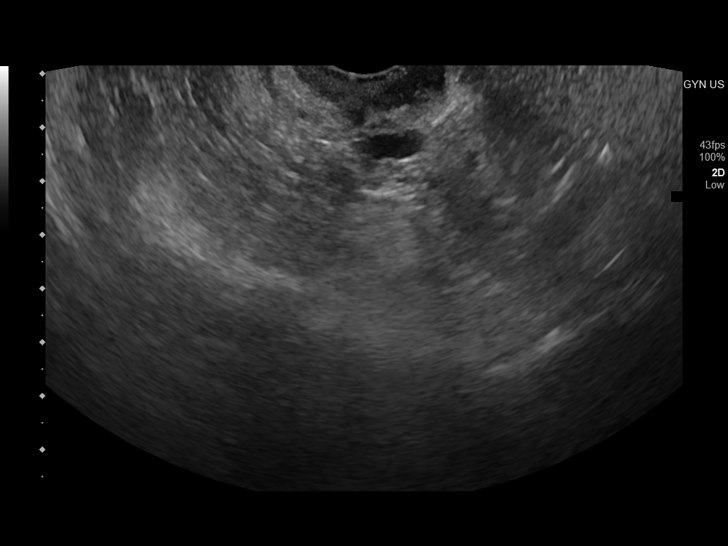
[im 111/121]
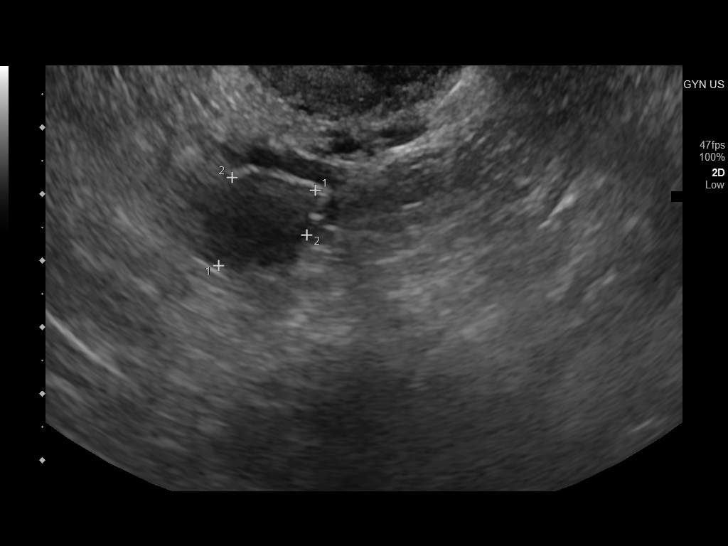
[im 121/121]
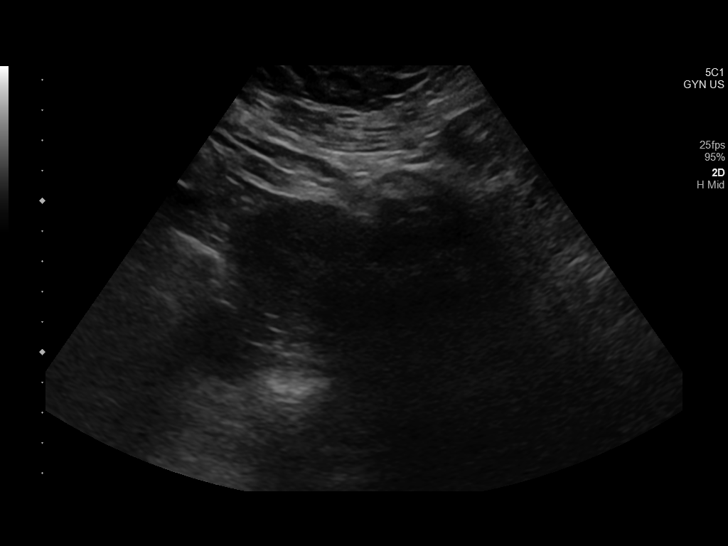

[13 of 25 positions shown; findings below may reference images not displayed]

FINDINGS: Uterus

Measurements: 11.0 x 5.9 x 5.6 cm = volume: 191 mL. Anteverted.
Uterine fundus suboptimally visualized due to bowel on both
transabdominal and endovaginal imaging. No gross uterine mass seen.
Complex nabothian cyst at cervix.

Endometrium

Thickness: 9 mm.  No endometrial fluid or mass

Right ovary

Measurements: 4.0 x 2.8 x 3.9 cm = volume: 23.3 mL. Asymmetrically
larger than LEFT and edematous. Ovary increased in size since CT
exam in [DATE] it measured 2.1 cm greatest diameter. Diminished
internal blood flow on color Doppler imaging.

Left ovary

Measurements: 1.8 x 1.4 x 1.6 cm = volume: 2.2 mL. Normal morphology
without mass. Blood flow present on color Doppler imaging.

Pulsed Doppler evaluation of both ovaries demonstrates normal
low-resistance arterial and venous waveforms in the LEFT ovary.
Within the RIGHT ovary, some venous flow and slightly higher
resistance arterial flow are identified.

Other findings

Trace free pelvic fluid.  No adnexal masses.
IMPRESSION: Asymmetric enlargement and edema of the RIGHT ovary with
diminished/impaired intraovarian flow.

Findings are most consistent with incomplete RIGHT ovarian torsion
versus RIGHT ovarian torsion/de-torsion.

Remainder of exam unremarkable.

Critical Value/emergent results were called by telephone at the time
of interpretation on 05/19/2021 at [DATE] to provider AHYLIN PHANORD
ULISES, who verbally acknowledged these results.

## 2023-05-28 ENCOUNTER — Encounter: Payer: Self-pay | Admitting: Pediatrics

## 2023-05-28 ENCOUNTER — Ambulatory Visit (INDEPENDENT_AMBULATORY_CARE_PROVIDER_SITE_OTHER): Payer: Self-pay | Admitting: Pediatrics

## 2023-05-28 VITALS — BP 121/78 | HR 79 | Temp 98.7°F | Ht 62.5 in | Wt 239.4 lb

## 2023-05-28 DIAGNOSIS — G8929 Other chronic pain: Secondary | ICD-10-CM

## 2023-05-28 DIAGNOSIS — M5442 Lumbago with sciatica, left side: Secondary | ICD-10-CM

## 2023-05-28 DIAGNOSIS — Z1330 Encounter for screening examination for mental health and behavioral disorders, unspecified: Secondary | ICD-10-CM

## 2023-05-28 DIAGNOSIS — Z7689 Persons encountering health services in other specified circumstances: Secondary | ICD-10-CM

## 2023-05-28 DIAGNOSIS — M48061 Spinal stenosis, lumbar region without neurogenic claudication: Secondary | ICD-10-CM

## 2023-05-28 MED ORDER — BACLOFEN 10 MG PO TABS: 10.0000 mg | ORAL_TABLET | Freq: Two times a day (BID) | ORAL | 0 refills | Status: DC | PRN

## 2023-05-28 MED ORDER — CELECOXIB 200 MG PO CAPS: 200.0000 mg | ORAL_CAPSULE | Freq: Two times a day (BID) | ORAL | 0 refills | Status: DC

## 2023-05-28 NOTE — Progress Notes (Signed)
 Establish Care Note  BP 121/78   Pulse 79   Temp 98.7 F (37.1 C) (Oral)   Ht 5' 2.5" (1.588 m)   Wt 239 lb 6.4 oz (108.6 kg)   LMP 01/19/2023 (Approximate)   SpO2 97%   BMI 43.09 kg/m    Subjective:    Patient ID: [name], female    DOB: 1975-05-03, 48 y.o.   MRN: [id number]  HPI: KHALILAH HOKE is a 48 y.o. female  Chief Complaint  Patient presents with   Pain    Patient states she had a fall and was seen at the ER 04/02/23. States she is still having a lot of pains from this fall and the medication she was given are not really helping. States she feels like the pain is mainly in her L hip. States she hurts more with getting up from a sitting position and when she has been standing for a long time.     Establishing care, the following was discussed today:  Discussed the use of AI scribe software for clinical note transcription with the patient, who gave verbal consent to proceed.  History of Present Illness   ISAAC LACSON is a 48 year old female with degenerative disc disease who presents with hip pain following a fall.  She experienced a fall in January while cleaning in the kitchen when a footstool collapsed, causing her to land on a concrete slab. Since then, she has had persistent hip pain described as 'like somebody is just taking hot and soldering my hip,' present continuously. The pain has resulted in a limp and difficulty walking, sometimes requiring her to physically lift her leg to move it. The pain radiates down her leg, occasionally reaching her toes, and is associated with muscle tightness and spasms.  She has a history of degenerative disc disease, diagnosed in 2000 following an episode of paralysis on her right side. The fall has exacerbated her condition, with a shift in her spine increasing pain on the left side. Previous treatments, including cortisone injections and medications such as diclofenac, tizanidine, meloxicam, gabapentin, and  hydrocodone, have not provided significant relief. She takes tizanidine once daily, despite a twice-daily prescription, due to its sedative effects, and uses energy drinks to counteract drowsiness.  Her medical history also includes a bulging disc that compressed a nerve on her right side and a past ovarian torsion that was not surgically resolved. She is currently without medical insurance due to an administrative error at her workplace, complicating her access to healthcare.  Family history includes a grandmother who died of cervical cancer, necessitating regular gynecological check-ups.        Current Outpatient Medications on File Prior to Visit  Medication Sig Dispense Refill   albuterol (VENTOLIN HFA) 108 (90 Base) MCG/ACT inhaler Inhale 2 puffs into the lungs every 6 (six) hours as needed for wheezing or shortness of breath.     benzonatate (TESSALON) 100 MG capsule Take 100 mg by mouth 3 (three) times daily as needed for cough.     montelukast (SINGULAIR) 10 MG tablet Take 10 mg by mouth daily.     triamcinolone (NASACORT) 55 MCG/ACT AERO nasal inhaler Place 2 sprays into the nose daily.     No current facility-administered medications on file prior to visit.    #HM Will review HM records and updated as needed.  Relevant past medical, surgical, family and social history reviewed and updated as indicated. Interim medical history since our last visit  reviewed. Allergies and medications reviewed and updated.  ROS per HPI unless specifically indicated above     Objective:    BP 121/78   Pulse 79   Temp 98.7 F (37.1 C) (Oral)   Ht 5' 2.5" (1.588 m)   Wt 239 lb 6.4 oz (108.6 kg)   LMP 01/19/2023 (Approximate)   SpO2 97%   BMI 43.09 kg/m   Wt Readings from Last 3 Encounters:  05/28/23 239 lb 6.4 oz (108.6 kg)  04/02/23 236 lb 15.9 oz (107.5 kg)  01/29/23 237 lb (107.5 kg)     Physical Exam Constitutional:      Appearance: Normal appearance.  Pulmonary:     Effort:  Pulmonary effort is normal.  Musculoskeletal:     Thoracic back: Tenderness present.     Lumbar back: Tenderness present. No swelling or deformity. Decreased range of motion. Positive left straight leg raise test.  Skin:    Comments: Normal skin color  Neurological:     General: No focal deficit present.     Mental Status: She is alert. Mental status is at baseline.  Psychiatric:        Mood and Affect: Mood normal.        Behavior: Behavior normal.        Thought Content: Thought content normal.         05/28/2023    3:02 PM  Depression screen PHQ 2/9  Decreased Interest 0  Down, Depressed, Hopeless 0  PHQ - 2 Score 0  Altered sleeping 0  Tired, decreased energy 0  Change in appetite 0  Feeling bad or failure about yourself  0  Trouble concentrating 0  Moving slowly or fidgety/restless 0  Suicidal thoughts 0  PHQ-9 Score 0  Difficult doing work/chores Not difficult at all        05/28/2023    3:02 PM  GAD 7 : Generalized Anxiety Score  Nervous, Anxious, on Edge 0  Control/stop worrying 0  Worry too much - different things 0  Trouble relaxing 1  Restless 3  Easily annoyed or irritable 0  Afraid - awful might happen 0  Total GAD 7 Score 4  Anxiety Difficulty Not difficult at all       Assessment & Plan:  Assessment & Plan   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 Foraminal stenosis on imaging. Significant functional impairment. Previous treatments ineffective. Referral to neurosurgery for evaluation.. Baclofen considered for muscle relaxation. Celebrex chosen for inflammation. - Refer to neurosurgery for evaluation and potential surgical intervention. - Prescribe baclofen twice daily as needed, starting at night. - Prescribe Celebrex 200 mg twice daily. - Advise against ibuprofen while on Celebrex. - Provide GoodRx coupon for  medication cost reduction. - Instruct to contact office if medication costs exceed expected amount. -     Baclofen; Take 1 tablet (10 mg total) by mouth 2 (two) times daily as needed for muscle spasms.  Dispense: 30 each; Refill: 0 -     Celecoxib; Take 1 capsule (200 mg total) by mouth 2 (two) times daily.  Dispense: 60 capsule; Refill: 0 -     Ambulatory referral to Neurosurgery  Encounter to establish care Reviewed available patient record including history, medications, problem list. HM updated as able. Will review and/or request outside records (if applicable) and will fill remaining HM gaps as needed at follow up visit. Lack of preventive care due  to insurance issues and administrative errors. Family history of cervical cancer requires regular screenings. Plans to address healthcare coverage and screenings. - Schedule follow-up in 6 weeks for preventive care and screenings. - Investigate bridge programs for healthcare coverage. - Provide GoodRx coupon for medication costs.   Encounter for behavioral health screening As part of their intake evaluation, the patient was screened for depression, anxiety.  PHQ9 SCORE 0, GAD7 SCORE 4. Screening results negative for tested conditions. CTM.   Follow up plan: Return in about 6 weeks (around 07/09/2023) for Physical, pap.  Jackolyn Confer, MD

## 2023-05-28 NOTE — Patient Instructions (Addendum)
 Switch muscle relaxant to baclofen - can take twice daily as needed  Celebrex 200mg  twice daily for 30 days  Good to meet you! Welcome to [facility]!  As your primary care doctor, I look forward to working with you to help you reach your health goals.  Please be aware of a couple of logistical items: - If you message me on mychart, it may take me 1-2 business days to get back to you. This is for non-urgent messaging.  - If you require urgent clinical attention, please call the clinic or present to urgent care/emergency room - If you have labs, I typically will send a message about them in 1-2 business days. - I am not here on Mondays, otherwise will be available from Tuesday-Friday during 8a-5pm.
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
## 2023-06-02 ENCOUNTER — Encounter: Payer: Self-pay | Admitting: Pediatrics

## 2023-06-02 DIAGNOSIS — M5030 Other cervical disc degeneration, unspecified cervical region: Secondary | ICD-10-CM | POA: Insufficient documentation

## 2023-06-05 ENCOUNTER — Ambulatory Visit
Admission: RE | Admit: 2023-06-05 | Discharge: 2023-06-05 | Disposition: A | Discharge status: Home / Self Care | Payer: Self-pay | Attending: Physician Assistant | Admitting: Physician Assistant

## 2023-06-05 ENCOUNTER — Ambulatory Visit (INDEPENDENT_AMBULATORY_CARE_PROVIDER_SITE_OTHER): Payer: Self-pay | Admitting: Physician Assistant

## 2023-06-05 ENCOUNTER — Ambulatory Visit
Admission: RE | Admit: 2023-06-05 | Discharge: 2023-06-05 | Disposition: A | Discharge status: Home / Self Care | Payer: Self-pay | Source: Ambulatory Visit | Attending: Physician Assistant | Admitting: Physician Assistant

## 2023-06-05 VITALS — BP 118/78 | Ht 62.5 in | Wt 231.0 lb

## 2023-06-05 DIAGNOSIS — M5416 Radiculopathy, lumbar region: Secondary | ICD-10-CM | POA: Diagnosis present

## 2023-06-05 DIAGNOSIS — G8929 Other chronic pain: Secondary | ICD-10-CM

## 2023-06-05 DIAGNOSIS — M545 Low back pain, unspecified: Secondary | ICD-10-CM

## 2023-06-05 DIAGNOSIS — R531 Weakness: Secondary | ICD-10-CM

## 2023-06-11 ENCOUNTER — Ambulatory Visit
Admission: RE | Admit: 2023-06-11 | Discharge: 2023-06-11 | Disposition: A | Discharge status: Home / Self Care | Payer: Self-pay | Source: Ambulatory Visit | Attending: Physician Assistant | Admitting: Physician Assistant

## 2023-06-11 DIAGNOSIS — M5416 Radiculopathy, lumbar region: Secondary | ICD-10-CM

## 2023-06-11 DIAGNOSIS — R531 Weakness: Secondary | ICD-10-CM

## 2023-06-23 ENCOUNTER — Ambulatory Visit: Payer: Self-pay

## 2023-07-03 ENCOUNTER — Emergency Department: Payer: Self-pay

## 2023-07-03 ENCOUNTER — Emergency Department
Admission: EM | Admit: 2023-07-03 | Discharge: 2023-07-03 | Disposition: A | Discharge status: Home / Self Care | Payer: Self-pay | Attending: Emergency Medicine | Admitting: Emergency Medicine

## 2023-07-03 ENCOUNTER — Other Ambulatory Visit: Payer: Self-pay

## 2023-07-03 DIAGNOSIS — J45909 Unspecified asthma, uncomplicated: Secondary | ICD-10-CM | POA: Diagnosis not present

## 2023-07-03 DIAGNOSIS — M25512 Pain in left shoulder: Secondary | ICD-10-CM | POA: Insufficient documentation

## 2023-07-03 DIAGNOSIS — R0789 Other chest pain: Secondary | ICD-10-CM | POA: Diagnosis present

## 2023-07-03 DIAGNOSIS — Y92410 Unspecified street and highway as the place of occurrence of the external cause: Secondary | ICD-10-CM | POA: Insufficient documentation

## 2023-07-03 DIAGNOSIS — M549 Dorsalgia, unspecified: Secondary | ICD-10-CM | POA: Insufficient documentation

## 2023-07-03 DIAGNOSIS — M542 Cervicalgia: Secondary | ICD-10-CM | POA: Insufficient documentation

## 2023-07-03 DIAGNOSIS — R0781 Pleurodynia: Secondary | ICD-10-CM | POA: Diagnosis not present
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
## 2023-07-06 ENCOUNTER — Telehealth: Payer: Self-pay | Admitting: Emergency Medicine

## 2023-07-06 NOTE — Telephone Encounter (Signed)
 Called patient and informed of rib xray negative. She is feeling better.

## 2023-07-09 ENCOUNTER — Encounter: Payer: Self-pay | Admitting: Pediatrics

## 2023-07-09 ENCOUNTER — Ambulatory Visit (INDEPENDENT_AMBULATORY_CARE_PROVIDER_SITE_OTHER): Payer: Self-pay | Admitting: Pediatrics

## 2023-07-09 VITALS — BP 137/82 | HR 89 | Temp 97.5°F | Ht 62.0 in | Wt 240.4 lb

## 2023-07-09 DIAGNOSIS — Z131 Encounter for screening for diabetes mellitus: Secondary | ICD-10-CM

## 2023-07-09 DIAGNOSIS — Z0000 Encounter for general adult medical examination without abnormal findings: Secondary | ICD-10-CM

## 2023-07-09 DIAGNOSIS — Z124 Encounter for screening for malignant neoplasm of cervix: Secondary | ICD-10-CM

## 2023-07-09 DIAGNOSIS — Z1211 Encounter for screening for malignant neoplasm of colon: Secondary | ICD-10-CM

## 2023-07-09 DIAGNOSIS — Z1330 Encounter for screening examination for mental health and behavioral disorders, unspecified: Secondary | ICD-10-CM

## 2023-07-09 DIAGNOSIS — Z13220 Encounter for screening for lipoid disorders: Secondary | ICD-10-CM
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
## 2023-07-10 LAB — COMPREHENSIVE METABOLIC PANEL WITH GFR
ALT: 21 IU/L (ref 0–32)
AST: 16 IU/L (ref 0–40)
Albumin: 4.4 g/dL (ref 3.9–4.9)
Alkaline Phosphatase: 119 IU/L (ref 44–121)
BUN/Creatinine Ratio: 20 (ref 9–23)
BUN: 11 mg/dL (ref 6–24)
Bilirubin Total: 0.4 mg/dL (ref 0.0–1.2)
CO2: 27 mmol/L (ref 20–29)
Calcium: 9.2 mg/dL (ref 8.7–10.2)
Chloride: 102 mmol/L (ref 96–106)
Creatinine, Ser: 0.54 mg/dL — ABNORMAL LOW (ref 0.57–1.00)
Globulin, Total: 2.4 g/dL (ref 1.5–4.5)
Glucose: 94 mg/dL (ref 70–99)
Potassium: 4.1 mmol/L (ref 3.5–5.2)
Sodium: 140 mmol/L (ref 134–144)
Total Protein: 6.8 g/dL (ref 6.0–8.5)
eGFR: 113 mL/min/{1.73_m2} (ref >59)

## 2023-07-10 LAB — LIPID PANEL
Chol/HDL Ratio: 3.7 ratio (ref 0.0–4.4)
Cholesterol, Total: 169 mg/dL (ref 100–199)
HDL: 46 mg/dL (ref >39)
LDL Chol Calc (NIH): 100 mg/dL — ABNORMAL HIGH (ref 0–99)
Triglycerides: 132 mg/dL (ref 0–149)
VLDL Cholesterol Cal: 23 mg/dL (ref 5–40)

## 2023-07-10 LAB — HEMOGLOBIN A1C
Est. average glucose Bld gHb Est-mCnc: 131 mg/dL
Hgb A1c MFr Bld: 6.2 % — ABNORMAL HIGH (ref 4.8–5.6)

## 2023-07-17 ENCOUNTER — Encounter: Payer: Self-pay | Admitting: Pediatrics

## 2023-07-20 ENCOUNTER — Ambulatory Visit: Admitting: Physician Assistant

## 2023-07-27 ENCOUNTER — Ambulatory Visit (INDEPENDENT_AMBULATORY_CARE_PROVIDER_SITE_OTHER): Payer: Self-pay | Admitting: Physician Assistant

## 2023-07-27 VITALS — BP 128/78 | Ht 62.0 in | Wt 242.0 lb

## 2023-07-27 DIAGNOSIS — M5416 Radiculopathy, lumbar region: Secondary | ICD-10-CM

## 2023-07-27 MED ORDER — GABAPENTIN 300 MG PO CAPS: 300.0000 mg | ORAL_CAPSULE | Freq: Every day | ORAL | 3 refills | Status: DC
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
## 2023-09-28 ENCOUNTER — Ambulatory Visit: Admitting: Neurosurgery

## 2023-09-28 ENCOUNTER — Ambulatory Visit: Admitting: Physician Assistant

## 2023-10-12 ENCOUNTER — Encounter: Payer: Self-pay | Admitting: Neurosurgery

## 2023-10-13 ENCOUNTER — Encounter: Payer: Self-pay | Admitting: Pediatrics

## 2023-10-13 ENCOUNTER — Ambulatory Visit (INDEPENDENT_AMBULATORY_CARE_PROVIDER_SITE_OTHER): Admitting: Pediatrics

## 2023-10-13 VITALS — BP 130/84 | HR 96 | Temp 97.7°F | Wt 242.2 lb

## 2023-10-13 DIAGNOSIS — G8929 Other chronic pain: Secondary | ICD-10-CM

## 2023-10-13 DIAGNOSIS — M5442 Lumbago with sciatica, left side: Secondary | ICD-10-CM | POA: Diagnosis not present

## 2023-10-13 DIAGNOSIS — Z6841 Body Mass Index (BMI) 40.0 and over, adult: Secondary | ICD-10-CM

## 2023-10-13 DIAGNOSIS — G43009 Migraine without aura, not intractable, without status migrainosus: Secondary | ICD-10-CM

## 2023-10-13 DIAGNOSIS — M5030 Other cervical disc degeneration, unspecified cervical region: Secondary | ICD-10-CM

## 2023-10-13 DIAGNOSIS — R252 Cramp and spasm: Secondary | ICD-10-CM

## 2023-10-13 DIAGNOSIS — M79669 Pain in unspecified lower leg: Secondary | ICD-10-CM

## 2023-10-13 MED ORDER — BACLOFEN 10 MG PO TABS: 10.0000 mg | ORAL_TABLET | Freq: Two times a day (BID) | ORAL | 0 refills | Status: AC | PRN

## 2023-10-13 MED ORDER — CELECOXIB 200 MG PO CAPS: 200.0000 mg | ORAL_CAPSULE | Freq: Two times a day (BID) | ORAL | 0 refills | Status: AC

## 2023-10-13 NOTE — Patient Instructions (Addendum)
 Try over the counter magnesium for the cramps Will let you know the lab results  Call the neurosurgery team to schedule

## 2023-10-13 NOTE — Progress Notes (Unsigned)
 Office Visit  BP 130/84   Pulse 96   Temp 97.7 F (36.5 C) (Oral)   Wt 242 lb 3.2 oz (109.9 kg)   SpO2 94%   BMI 44.30 kg/m    Subjective:    Patient ID: Tammy Hansen, female    DOB: 10/16/1975, 48 y.o.   MRN: [id number]  HPI: Tammy Hansen is a 48 y.o. female  Chief Complaint  Patient presents with   Spasms    Behind the calves on both legs    Discussed the use of AI scribe software for clinical note transcription with the patient, who gave verbal consent to proceed.  History of Present Illness   Tammy Hansen is a 48 year old female who presents with leg cramps and back pain.  She has been experiencing cramps and soreness in her legs, particularly from her knees downwards, starting on Saturday. The pain initially began in her knees and then moved to her calves, with one leg feeling as if it was in a cramp position. The pain is described as tender and sore, without noticeable swelling. The symptoms started in one leg and then moved to the other, but currently, one leg feels okay. No swelling in the legs is noted.  She reports a pulsating sensation in the center of her lower spine, which radiates pain from her knees downwards. This sensation has occurred multiple times, including an episode at work where she felt temporarily paralyzed when bending down. She describes the pain as locking her back with pain, but she was able to stand back up and continue her day. She mentions a past MRI in April, and recalls being told it showed a hernia. She is nearly 100 pounds overweight and has experienced significant weight gain with steroid use in the past.  She has a history of carpal tunnel syndrome, which she believes has returned. She experiences dropping things unexpectedly and wrist pain, especially when pointing or using her hands. She had surgery for this condition in 2011. She does not currently have a brace due to recent moves.  She has been using Celebrex  for pain  management and takes it when the pain becomes severe, along with a muscle relaxer. It takes about an hour to take effect but provides significant relief.  She is prediabetic, which she attributes to her weight. She has not been diagnosed with diabetes, despite a previous healthcare provider suggesting otherwise.      Relevant past medical, surgical, family and social history reviewed and updated as indicated. Interim medical history since our last visit reviewed. Allergies and medications reviewed and updated.  ROS per HPI unless specifically indicated above     Objective:    BP 130/84   Pulse 96   Temp 97.7 F (36.5 C) (Oral)   Wt 242 lb 3.2 oz (109.9 kg)   SpO2 94%   BMI 44.30 kg/m   Wt Readings from Last 3 Encounters:  10/13/23 242 lb 3.2 oz (109.9 kg)  07/27/23 242 lb (109.8 kg)  07/09/23 240 lb 6.4 oz (109 kg)     Physical Exam Constitutional:      Appearance: Normal appearance.  Pulmonary:     Effort: Pulmonary effort is normal.  Musculoskeletal:        General: Normal range of motion.  Skin:    Comments: Normal skin color  Neurological:     General: No focal deficit present.     Mental Status: She is alert. Mental status is at  baseline.  Psychiatric:        Mood and Affect: Mood normal.        Behavior: Behavior normal.        Thought Content: Thought content normal.         10/13/2023    2:09 PM 07/09/2023    2:15 PM 05/28/2023    3:02 PM  Depression screen PHQ 2/9  Decreased Interest 0 1 0  Down, Depressed, Hopeless 0 0 0  PHQ - 2 Score 0 1 0  Altered sleeping 2 1 0  Tired, decreased energy 3 1 0  Change in appetite 0 0 0  Feeling bad or failure about yourself  0 0 0  Trouble concentrating 1 0 0  Moving slowly or fidgety/restless 1 0 0  Suicidal thoughts 0 0 0  PHQ-9 Score 7 3 0  Difficult doing work/chores Very difficult Very difficult Not difficult at all       10/13/2023    2:10 PM 07/09/2023    2:15 PM 05/28/2023    3:02 PM  GAD 7 :  Generalized Anxiety Score  Nervous, Anxious, on Edge 0 0 0  Control/stop worrying 0 0 0  Worry too much - different things 0 0 0  Trouble relaxing 1 1 1   Restless 0 0 3  Easily annoyed or irritable 0 0 0  Afraid - awful might happen 0 0 0  Total GAD 7 Score 1 1 4   Anxiety Difficulty Somewhat difficult Somewhat difficult Not difficult at all       Assessment & Plan:  Assessment & Plan   Chronic left-sided low back pain with left-sided sciatica Assessment & Plan: Chronic lumbar disc herniation with radiculopathy has recently worsened, causing severe pain and temporary paralysis sensation. She declined steroid treatment due to weight concerns. Contact Lyle Butters to discuss further management and potential nerve studies. Continue Celebrex  and muscle relaxer as needed for pain management.  Orders: -     Baclofen ; Take 1 tablet (10 mg total) by mouth 2 (two) times daily as needed for muscle spasms.  Dispense: 30 each; Refill: 0 -     Celecoxib ; Take 1 capsule (200 mg total) by mouth 2 (two) times daily.  Dispense: 60 capsule; Refill: 0  DDD (degenerative disc disease), cervical Assessment & Plan: Cervical degenerative disc disease may contribute to migraine symptoms. She declined steroid injections due to weight concerns. Consider referral to physical medicine for alternative therapies, avoiding steroid injections.   BMI 40.0-44.9, adult (HCC) Obesity, with approximately 100 pounds overweight, raises concerns about weight management. Discuss potential benefits of weight loss on pain management and check insurance coverage for weight loss medications such as Wegovy  and Zepbound.   Migraine without aura and without status migrainosus, not intractable Assessment & Plan: Migraine headaches without aura are associated with vision difficulties and nausea, possibly linked to cervical degenerative disc disease. Prescribe Maxalt (rizatriptan) for abortive migraine treatment.   Calf  cramp Calf tenderness Subacute bilateral calf and knee pain may result from muscle overuse or a potential blood clot, though a clot is less likely without swelling. Order a blood test to rule out a blood clot and recommend over-the-counter magnesium nightly for muscle tenderness. -     D-dimer, quantitative   Clinical coverage for weight loss GLP's  Medication being dispensed is Wegovy  3 mL/28 day. Titration doses are 2 mL/28 days.   [x]  Product being prescribed is FDA approved for the indication, age, weight (if applicable) and not does  not exceed dosing limits per the Prescribing Information per the clinical conditions for use.  [x]  Patient's baseline weight measured within the last 45 days as required by provider before dispensing.  [x]  Patient is new to therapy and One of the following:   [x]  The beneficiary is 50 years of age or over and has ONE of the following:  [x]  A BMI greater than or equal to 30 kg/m2  []  A BMI greater than or equal to 27 kg/m2 with at least one weight-related comorbidity/risk factor/complication (i.e. hypertension, type 2 diabetes, obstructive sleep apnea, cardiovascular disease, dyslipidemia)  If patient has one weight-related comorbidity/risk factor/complication (i.e. hypertension, type 2 diabetes, obstructive sleep apnea, cardiovascular disease, dyslipidemia), please list prediabetes  [x]  The beneficiary is 43 years of age or older with a BMI greater than or equal to 27 kg/m2 AND has established cardiovascular disease (CVD) defined as having a history of myocardial infarction, stroke, or symptomatic peripheral disease, to be documented on the PA form. AND  [x]  The beneficiary is currently on and will continue lifestyle modification including structured nutrition and physical activity, unless physical activity is not clinically appropriate at the time GLP1 therapy commences AND  [x]  The beneficiary will NOT be using the requested agent in combination with  another GLP-1 receptor agonist agent AND  [x]  The beneficiary does NOT have any FDA-labeled contraindications to the requested agent, including pregnancy, lactation, history of medullary thyroid cancer or multiple endocrine neoplasia type II.   Last BMI/Weight/Height recorded Estimated body mass index is 44.3 kg/m as calculated from the following:   Height as of 07/27/23: 5' 2 (1.575 m).   Weight as of this encounter: 242 lb 3.2 oz (109.9 kg).   Follow up plan: Return in about 3 months (around 01/13/2024) for wt management.  Hadassah SHAUNNA Nett, MD

## 2023-10-14 ENCOUNTER — Ambulatory Visit: Payer: Self-pay | Admitting: Pediatrics

## 2023-10-14 LAB — D-DIMER, QUANTITATIVE: D-DIMER: 0.22 mg{FEU}/L (ref 0.00–0.49)

## 2023-10-18 DIAGNOSIS — G8929 Other chronic pain: Secondary | ICD-10-CM | POA: Insufficient documentation

## 2023-10-20 ENCOUNTER — Encounter: Payer: Self-pay | Admitting: Pediatrics

## 2023-10-20 DIAGNOSIS — Z6841 Body Mass Index (BMI) 40.0 and over, adult: Secondary | ICD-10-CM | POA: Insufficient documentation

## 2023-10-20 DIAGNOSIS — G43009 Migraine without aura, not intractable, without status migrainosus: Secondary | ICD-10-CM | POA: Insufficient documentation

## 2023-10-20 MED ORDER — WEGOVY 0.25 MG/0.5ML ~~LOC~~ SOAJ: 0.2500 mg | SUBCUTANEOUS | 0 refills | Status: AC

## 2023-10-20 NOTE — Assessment & Plan Note (Signed)
 Cervical degenerative disc disease may contribute to migraine symptoms. She declined steroid injections due to weight concerns. Consider referral to physical medicine for alternative therapies, avoiding steroid injections.

## 2023-10-20 NOTE — Assessment & Plan Note (Signed)
 Chronic lumbar disc herniation with radiculopathy has recently worsened, causing severe pain and temporary paralysis sensation. She declined steroid treatment due to weight concerns. Contact Lyle Butters to discuss further management and potential nerve studies. Continue Celebrex  and muscle relaxer as needed for pain management.

## 2023-10-20 NOTE — Assessment & Plan Note (Signed)
 Migraine headaches without aura are associated with vision difficulties and nausea, possibly linked to cervical degenerative disc disease. Prescribe Maxalt (rizatriptan) for abortive migraine treatment.

## 2023-12-07 ENCOUNTER — Other Ambulatory Visit: Payer: Self-pay | Admitting: Medical Genetics

## 2024-01-14 ENCOUNTER — Ambulatory Visit: Admitting: Pediatrics

## 2024-03-31 ENCOUNTER — Other Ambulatory Visit: Payer: Self-pay

## 2024-03-31 ENCOUNTER — Emergency Department
Admission: EM | Admit: 2024-03-31 | Discharge: 2024-03-31 | Disposition: A | Discharge status: Home / Self Care | Attending: Emergency Medicine | Admitting: Emergency Medicine

## 2024-03-31 ENCOUNTER — Emergency Department

## 2024-03-31 DIAGNOSIS — R079 Chest pain, unspecified: Secondary | ICD-10-CM | POA: Insufficient documentation

## 2024-03-31 DIAGNOSIS — J45909 Unspecified asthma, uncomplicated: Secondary | ICD-10-CM | POA: Diagnosis not present

## 2024-03-31 DIAGNOSIS — R0602 Shortness of breath: Secondary | ICD-10-CM | POA: Diagnosis not present

## 2024-03-31 LAB — CBC
HCT: 44.3 % (ref 36.0–46.0)
Hemoglobin: 14.6 g/dL (ref 12.0–15.0)
MCH: 27.9 pg (ref 26.0–34.0)
MCHC: 33 g/dL (ref 30.0–36.0)
MCV: 84.5 fL (ref 80.0–100.0)
Platelets: 274 K/uL (ref 150–400)
RBC: 5.24 MIL/uL — ABNORMAL HIGH (ref 3.87–5.11)
RDW: 12.6 % (ref 11.5–15.5)
WBC: 8.7 K/uL (ref 4.0–10.5)
nRBC: 0 % (ref 0.0–0.2)

## 2024-03-31 LAB — TROPONIN T, HIGH SENSITIVITY
Troponin T High Sensitivity: <6 ng/L (ref 0–19)
Troponin T High Sensitivity: <6 ng/L (ref 0–19)

## 2024-03-31 LAB — BASIC METABOLIC PANEL WITH GFR
Anion gap: 17 — ABNORMAL HIGH (ref 5–15)
BUN: 18 mg/dL (ref 6–20)
CO2: 24 mmol/L (ref 22–32)
Calcium: 9.8 mg/dL (ref 8.9–10.3)
Chloride: 98 mmol/L (ref 98–111)
Creatinine, Ser: 0.51 mg/dL (ref 0.44–1.00)
GFR, Estimated: >60 mL/min
Glucose, Bld: 100 mg/dL — ABNORMAL HIGH (ref 70–99)
Potassium: 3.8 mmol/L (ref 3.5–5.1)
Sodium: 138 mmol/L (ref 135–145)

## 2024-03-31 LAB — POC URINE PREG, ED: Preg Test, Ur: NEGATIVE

## 2024-03-31 LAB — D-DIMER, QUANTITATIVE: D-Dimer, Quant: <0.27 ug{FEU}/mL (ref 0.00–0.50)

## 2024-03-31 LAB — PRO BRAIN NATRIURETIC PEPTIDE: Pro Brain Natriuretic Peptide: <50 pg/mL

## 2024-03-31 MED ORDER — KETOROLAC TROMETHAMINE 15 MG/ML IJ SOLN
15.0000 mg | Freq: Once | INTRAMUSCULAR | Status: AC
  Administered 2024-03-31: 15 mg via INTRAVENOUS
  Filled 2024-03-31: qty 1

## 2024-03-31 MED ORDER — ACETAMINOPHEN 500 MG PO TABS
1000.0000 mg | ORAL_TABLET | Freq: Once | ORAL | Status: AC
  Administered 2024-03-31: 1000 mg via ORAL
  Filled 2024-03-31: qty 2

## 2024-03-31 MED ORDER — LIDOCAINE 5 % EX PTCH
1.0000 | MEDICATED_PATCH | CUTANEOUS | Status: DC
  Administered 2024-03-31: 1 via TRANSDERMAL
  Filled 2024-03-31: qty 1

## 2024-03-31 MED ORDER — LIDOCAINE 5 % EX PTCH: 1.0000 | MEDICATED_PATCH | CUTANEOUS | 0 refills | Status: AC

## 2024-03-31 NOTE — ED Notes (Signed)
 Called [facility] to add pt to monitoring.

## 2024-03-31 NOTE — ED Provider Notes (Signed)
 "  [facility] Provider Note    Event Date/Time   First MD Initiated Contact with Patient 03/31/24 1800     (approximate)   History   Chest Pain   HPI  Tammy Hansen is a 49 y.o. female with history of asthma who comes in with three days of cp and SOB.  She reports the chest pain is like electrical shocks, and now dull soreness. Non exertional CP.  No recent illness.  Patient does report that she does do a lot of lifting things up over her head but denies any known obvious strain.  She does report having a family history of heart problems which is why she decided to come in to ensure that she was not having any heart attack.  She denies any history of blood clots, leg swelling, recent travel, recent surgery, estrogen use or other risk factors for pulmonary embolism.  She does report that the shortness of breath is a little bit worse with taking a deep breath and a little bit of worsening chest pain.  She has not taken anything to try to help with the symptoms.  She reports that the dull soreness has been present now for 3 to 4 days consistently.  She denies the pain being radiating denies any numbness, tingling.   Physical Exam   Triage Vital Signs: ED Triage Vitals [03/31/24 1538]  Encounter Vitals Group     BP 135/78     Girls Systolic BP Percentile      Girls Diastolic BP Percentile      Boys Systolic BP Percentile      Boys Diastolic BP Percentile      Pulse Rate 85     Resp 20     Temp 97.9 F (36.6 C)     Temp Source Oral     SpO2 98 %     Weight      Height      Head Circumference      Peak Flow      Pain Score 6     Pain Loc      Pain Education      Exclude from Growth Chart     Most recent vital signs: Vitals:   03/31/24 1538  BP: 135/78  Pulse: 85  Resp: 20  Temp: 97.9 F (36.6 C)  SpO2: 98%     General: Awake, no distress.  CV:  Good peripheral perfusion.  Tenderness on the left chest wall.  No murmur Resp:  Normal effort.   Clear lung Abd:  No distention.  Soft and nontender Other:  No swelling in legs.  Good distal pulses throughout.  Sensation intact throughout No swelling in legs.  No calf tenderness  ED Results / Procedures / Treatments   Labs (all labs ordered are listed, but only abnormal results are displayed) Labs Reviewed  CBC - Abnormal; Notable for the following components:      Result Value   RBC 5.24 (*)    All other components within normal limits  BASIC METABOLIC PANEL WITH GFR  POC URINE PREG, ED  TROPONIN T, HIGH SENSITIVITY  TROPONIN T, HIGH SENSITIVITY     EKG  My interpretation of EKG:  normal sinus rate of 82 without any ST elevation or T wave inversions, normal intervals  RADIOLOGY I have reviewed the xray personally and interpreted patient has some mild cardiomegaly but no evidence of any pneumonia  Reviewed patient's prior chest x-ray from July 03, 2023 it does look pretty similar to then.   PROCEDURES:  Critical Care performed: No  .1-3 Lead EKG Interpretation  Performed by: Ernest Ronal BRAVO, MD Authorized by: Ernest Ronal BRAVO, MD     Interpretation: normal     ECG rate:  80   ECG rate assessment: normal     Rhythm: sinus rhythm     Ectopy: none     Conduction: normal      MEDICATIONS ORDERED IN ED: Medications  acetaminophen  (TYLENOL ) tablet 1,000 mg (has no administration in time range)  ketorolac  (TORADOL ) 15 MG/ML injection 15 mg (has no administration in time range)  lidocaine  (LIDODERM ) 5 % 1 patch (has no administration in time range)     IMPRESSION / MDM / ASSESSMENT AND PLAN / ED COURSE  I reviewed the triage vital signs and the nursing notes.   Patient's presentation is most consistent with acute presentation with potential threat to life or bodily function.      INITIAL IMPRESSION / ASSESSMENT AND PLAN / ED COURSE   Most Likely DDx:  -Consider ACS vs MSK (given tender with palpation)  Vs Thalia- will get EKG/troponin to evaluate for  ACS   DDx that was also considered d/t potential to cause harm, but was found less likely based on history and physical (as detailed above): -PNA (no fevers, cough but CXR to evaluate) -PNX (reassured with equal b/l breath sounds, CXR to evaluate) -Symptomatic anemia (will get H&H) -Pulmonary embolism low wells score PERC negative but given age 22 and pleuritic component will get ddimer  -Aortic Dissection as no tearing pain and no radiation to the mid back, pulses equal, no murmur -Pericarditis no EKG changes or hx to suggest dx -Tamponade (no notable SOB, tachycardic, hypotensive) -Esophageal rupture (no h/o diffuse vomitting/no crepitus)  Pregnancy test was negative.  BNP was negative D-dimer was negative troponins are negative x 2.  Patient's BMP showed slightly elevated anion gap..  Patient's glucose however is reassuring no evidence of DKA.  Will encourage some p.o. hydration as I do not feel like she looks that dehydrated and this can be follow-up with her primary care doctor.  Given the x-ray showed some cardiomegaly although it was stable from prior x-ray I did do a bedside ultrasound and did not see any evidence of effusion.  Grossly reassuring EF however discussed with patient that I am not able to do formal ultrasounds and the need for follow-up with cardiology to discuss this.  She expressed understanding.  On repeat assessment her chest pain had resolved.  Given it was point tender on examination I do suspect this is more likely musculoskeletal.  Her workup so far is otherwise reassuring.    At this time given re-assuring workup I have considered CT imaging to rule out PE/Dissection but given history and physical exam these seem less likely and potential harm from CT would outweigh the probability of finding PE or Dissection.    I have considered admission for patient, but given re-assuring workup including EKG, troponin, and re-assuring vitals patient can follow up outpatient  with cardiology.  Discussed with patient that I can not predict future heart attacks and that cardiology can evaluate for need for further workup including stress test.  Explained to patient that if there is a change in symptoms, worsening symptoms, or any other concerns that they should return for repeat evaluation to have repeat EKG/troponin. They expressed understanding.  ____________________________________________   Note:  This document was prepared using Dragon  voice recognition software and may include unintentional dictation errors.   The patient is on the cardiac monitor to evaluate for evidence of arrhythmia and/or significant heart rate changes.      FINAL CLINICAL IMPRESSION(S) / ED DIAGNOSES   Final diagnoses:  Nonspecific chest pain     Rx / DC Orders   ED Discharge Orders          Ordered    Ambulatory referral to Cardiology        03/31/24 2140    lidocaine  (LIDODERM ) 5 %  Every 24 hours        03/31/24 2140             Note:  This document was prepared using Dragon voice recognition software and may include unintentional dictation errors.   Ernest Ronal BRAVO, MD 03/31/24 2141  "

## 2024-03-31 NOTE — Discharge Instructions (Addendum)
 Take Tylenol  1 g every 8 hours with ibuprofen  600 every 8 hours with food for 1 week to help with pain.  Use the lidocaine  patches.  I think that this could be related to a muscle given you are tender with palpation I do not see any evidence of heart attack or blood clots however I do want you to follow-up with cardiology as your heart was a little bit enlarged on your chest x-ray.  You develop any changes symptoms worsening symptoms you should return to the ER for repeat evaluation

## 2024-03-31 NOTE — ED Triage Notes (Signed)
 Pt to ED via POV from home. Pt reports centralized CP for the last few days with SOB. Denies cough, congestion, fevers. Denies cardiac hx.

## 2024-05-18 ENCOUNTER — Encounter: Admitting: Nurse Practitioner
